# Patient Record
Sex: Female | Born: 2000 | Race: White | Hispanic: No | Marital: Married | Smoking: Never smoker
Health system: Southern US, Community
[De-identification: ages and names within clinical notes are randomized; demographics above are authoritative.]

## PROBLEM LIST (undated history)

## (undated) DIAGNOSIS — Z8489 Family history of other specified conditions: Secondary | ICD-10-CM

## (undated) DIAGNOSIS — Q51810 Arcuate uterus: Secondary | ICD-10-CM

## (undated) DIAGNOSIS — R748 Abnormal levels of other serum enzymes: Secondary | ICD-10-CM

## (undated) HISTORY — DX: Arcuate uterus: Q51.810

## (undated) HISTORY — PX: WISDOM TOOTH EXTRACTION: SHX21

---
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
## 2018-04-15 ENCOUNTER — Other Ambulatory Visit: Payer: Self-pay | Admitting: Family

## 2018-04-15 DIAGNOSIS — F411 Generalized anxiety disorder: Secondary | ICD-10-CM

## 2018-06-04 DIAGNOSIS — R6884 Jaw pain: Secondary | ICD-10-CM | POA: Diagnosis not present

## 2018-06-04 DIAGNOSIS — M26602 Left temporomandibular joint disorder, unspecified: Secondary | ICD-10-CM | POA: Diagnosis not present

## 2018-07-03 DIAGNOSIS — Z111 Encounter for screening for respiratory tuberculosis: Secondary | ICD-10-CM | POA: Diagnosis not present

## 2018-07-04 ENCOUNTER — Telehealth: Payer: Self-pay | Admitting: *Deleted

## 2018-07-04 NOTE — Telephone Encounter (Signed)
Left voicemail for patient to call, office note placed up front for patient pick up

## 2018-07-04 NOTE — Telephone Encounter (Signed)
Copied from [place] [id number]. Topic: General - Other >> Jul 04, 2018 11:38 AM Lennox Solders wrote: Reason for CRM: pt is calling and needs a copy of physical on 08-23-2016 to be release to her  mychart

## 2018-07-09 NOTE — Telephone Encounter (Signed)
mychart message sent

## 2018-07-25 DIAGNOSIS — Z30014 Encounter for initial prescription of intrauterine contraceptive device: Secondary | ICD-10-CM | POA: Diagnosis not present

## 2018-07-25 DIAGNOSIS — Z3202 Encounter for pregnancy test, result negative: Secondary | ICD-10-CM | POA: Diagnosis not present

## 2018-08-23 ENCOUNTER — Telehealth: Payer: 59 | Admitting: Family

## 2018-08-23 DIAGNOSIS — B9689 Other specified bacterial agents as the cause of diseases classified elsewhere: Secondary | ICD-10-CM | POA: Diagnosis not present

## 2018-08-23 DIAGNOSIS — R059 Cough, unspecified: Secondary | ICD-10-CM

## 2018-08-23 DIAGNOSIS — J329 Chronic sinusitis, unspecified: Secondary | ICD-10-CM

## 2018-08-23 DIAGNOSIS — R05 Cough: Secondary | ICD-10-CM | POA: Diagnosis not present

## 2018-08-23 MED ORDER — BENZONATATE 100 MG PO CAPS: 100.0000 mg | ORAL_CAPSULE | Freq: Three times a day (TID) | ORAL | 0 refills | Status: DC | PRN

## 2018-08-23 MED ORDER — DOXYCYCLINE HYCLATE 100 MG PO TABS: 100.0000 mg | ORAL_TABLET | Freq: Two times a day (BID) | ORAL | 0 refills | Status: DC

## 2018-08-23 NOTE — Progress Notes (Signed)
Thank you for the details you included in the comment boxes. Those details are very helpful in determining the best course of treatment for you and help [place] to provide the best care.  We are sorry that you are not feeling well.  Here is how we plan to help!  Based on what you have shared with me it looks like you have sinusitis.  Sinusitis is inflammation and infection in the sinus cavities of the head.  Based on your presentation I believe you most likely have Acute Bacterial Sinusitis.  This is an infection caused by bacteria and is treated with antibiotics. I have prescribed Doxycycline 100mg  by mouth twice a day for 10 days. You may use an oral decongestant such as Mucinex D or if you have glaucoma or high blood pressure use plain Mucinex. Saline nasal spray help and can safely be used as often as needed for congestion.  If you develop worsening sinus pain, fever or notice severe headache and vision changes, or if symptoms are not better after completion of antibiotic, please schedule an appointment with a health care provider.    I have also sent Tessalon Perles 100mg , take 1-2 every 8 hours as needed for cough.    Sinus infections are not as easily transmitted as other respiratory infection, however we still recommend that you avoid close contact with loved ones, especially the very young and elderly.  Remember to wash your hands thoroughly throughout the day as this is the number one way to prevent the spread of infection!  Home Care:  Only take medications as instructed by your medical team.  Complete the entire course of an antibiotic.  Do not take these medications with alcohol.  A steam or ultrasonic humidifier can help congestion.  You can place a towel over your head and breathe in the steam from hot water coming from a faucet.  Avoid close contacts especially the very young and the elderly.  Cover your mouth when you cough or sneeze.  Always remember to wash your hands.  Get  Help Right Away If:  You develop worsening fever or sinus pain.  You develop a severe head ache or visual changes.  Your symptoms persist after you have completed your treatment plan.  Make sure you  Understand these instructions.  Will watch your condition.  Will get help right away if you are not doing well or get worse.  Your e-visit answers were reviewed by a board certified advanced clinical practitioner to complete your personal care plan.  Depending on the condition, your plan could have included both over the counter or prescription medications.  If there is a problem please reply  once you have received a response from your provider.  Your safety is important to [place].  If you have drug allergies check your prescription carefully.    You can use MyChart to ask questions about today's visit, request a non-urgent call back, or ask for a work or school excuse for 24 hours related to this e-Visit. If it has been greater than 24 hours you will need to follow up with your provider, or enter a new e-Visit to address those concerns.  You will get an e-mail in the next two days asking about your experience.  I hope that your e-visit has been valuable and will speed your recovery. Thank you for using e-visits.

## 2019-04-01 ENCOUNTER — Telehealth: Payer: 59 | Admitting: Family

## 2019-04-01 DIAGNOSIS — N760 Acute vaginitis: Secondary | ICD-10-CM

## 2019-04-01 MED ORDER — METRONIDAZOLE 500 MG PO TABS: 500.0000 mg | ORAL_TABLET | Freq: Two times a day (BID) | ORAL | 0 refills | Status: DC

## 2019-04-01 NOTE — Progress Notes (Signed)

## 2020-10-19 LAB — OB RESULTS CONSOLE GC/CHLAMYDIA
Chlamydia: NEGATIVE
Gonorrhea: NEGATIVE

## 2020-10-22 LAB — OB RESULTS CONSOLE HIV ANTIBODY (ROUTINE TESTING): HIV: NONREACTIVE

## 2020-10-22 LAB — OB RESULTS CONSOLE HEPATITIS B SURFACE ANTIGEN: Hepatitis B Surface Ag: NEGATIVE

## 2020-10-22 LAB — OB RESULTS CONSOLE RPR: RPR: NONREACTIVE

## 2020-10-22 LAB — OB RESULTS CONSOLE RUBELLA ANTIBODY, IGM: Rubella: IMMUNE

## 2020-12-24 ENCOUNTER — Encounter: Payer: Self-pay | Admitting: Advanced Practice Midwife

## 2020-12-24 ENCOUNTER — Other Ambulatory Visit: Payer: Self-pay

## 2020-12-24 ENCOUNTER — Ambulatory Visit (INDEPENDENT_AMBULATORY_CARE_PROVIDER_SITE_OTHER): Payer: BC Managed Care – PPO | Admitting: Advanced Practice Midwife

## 2020-12-24 VITALS — BP 100/70 | Wt 177.0 lb

## 2020-12-24 DIAGNOSIS — Z369 Encounter for antenatal screening, unspecified: Secondary | ICD-10-CM

## 2020-12-24 DIAGNOSIS — Z3402 Encounter for supervision of normal first pregnancy, second trimester: Secondary | ICD-10-CM

## 2020-12-24 DIAGNOSIS — Z3A16 16 weeks gestation of pregnancy: Secondary | ICD-10-CM

## 2020-12-24 LAB — POCT URINALYSIS DIPSTICK OB
Bilirubin, UA: NEGATIVE
Blood, UA: NEGATIVE
Glucose, UA: NEGATIVE
Ketones, UA: NEGATIVE
Leukocytes, UA: NEGATIVE
Nitrite, UA: NEGATIVE
Spec Grav, UA: 1.015 (ref 1.010–1.025)
Urobilinogen, UA: 0.2 E.U./dL
pH, UA: 6 (ref 5.0–8.0)

## 2020-12-24 NOTE — Progress Notes (Signed)
NOB- no concerns ?

## 2020-12-24 NOTE — Patient Instructions (Signed)

## 2020-12-25 DIAGNOSIS — Z3403 Encounter for supervision of normal first pregnancy, third trimester: Secondary | ICD-10-CM | POA: Insufficient documentation

## 2020-12-25 NOTE — Progress Notes (Addendum)
New Obstetric Patient H&P  Date of Service: 12/24/2020  Transfer of care from: [facility]  Chief Complaint: "Desires prenatal care"  History of Present Illness: Patient is a 20 y.o. G1P0000 Not Hispanic or [place] female, presents with amenorrhea and positive home pregnancy test. Patient's last menstrual period was 08/28/2020 (exact date). and based on [redacted]w[redacted]d u/s 10/18/20, her EDD is Estimated Date of Delivery: 06/12/21 and her EGA is [redacted]w[redacted]d. EDD. Cycles are 5 days, regular, and occur approximately every : 28 days. Her last pap smear was 10/18/20 and was no abnormalities.    She had a urine pregnancy test which was positive 3 month(s)  ago. Her last menstrual period was normal and lasted for  5 day(s). Since her LMP she claims she has experienced breast tenderness, fatigue, nausea, vomiting. She denies vaginal bleeding. Her past medical history is noncontributory. This is her first pregnancy.  Since her LMP, she admits to the use of tobacco products  no She claims she has lost 3 pounds since the start of her pregnancy.  There are cats in the home in the home: yes she does not take care of litterbox She admits close contact with children on a regular basis  Yes, she is a Chief Technology Officer  She has had chicken pox in the past no She has had Tuberculosis exposures, symptoms, or previously tested positive for TB   no Current or past history of domestic violence. no  Genetic Screening/Teratology Counseling: (Includes patient, baby's father, or anyone in either family with:)   68. Patient's age >/= 24 at [facility]  no 2. Thalassemia ([place], [place], [place], or Asian background): MCV<80  no 3. Neural tube defect (meningomyelocele, spina bifida, anencephaly)  no 4. Congenital heart defect  no  5. Down syndrome  no 6. Tay-Sachs (Jewish, [place])  no 7. Canavan's Disease  no 8. Sickle cell disease or trait (African)  no  9. Hemophilia or other blood disorders  no  10.  Muscular dystrophy  no  11. Cystic fibrosis  no  12. Huntington's Chorea  no  13. Mental retardation/autism  no 14. Other inherited genetic or chromosomal disorder  no 15. Maternal metabolic disorder (DM, PKU, etc)  no 16. Patient or FOB with a child with a birth defect not listed above no  16a. Patient or FOB with a birth defect themselves no 17. Recurrent pregnancy loss, or stillbirth  no  18. Any medications since LMP other than prenatal vitamins (include vitamins, supplements, OTC meds, drugs, alcohol)  no 19. Any other genetic/environmental exposure to discuss  no  Infection History:   1. Lives with someone with TB or TB exposed  no  2. Patient or partner has history of genital herpes  no 3. Rash or viral illness since LMP  no 4. History of STI (GC, CT, HPV, syphilis, HIV)  no 5. History of recent travel :  no  Other pertinent information:  no    Review of Systems:10 point review of systems negative unless otherwise noted in HPI  Past Medical History:  Patient Active Problem List   Diagnosis Date Noted  . Encounter for supervision of normal first pregnancy in second trimester 12/25/2020    Clinic [facility] Prenatal Labs  Dating By 6w u/s [facility] Blood type: A negative  Genetic Screen 1 Screen: declined   AFP:     Quad:     NIPS: Antibody: negative  Anatomic [place]  Rubella: immune  Varicella: @VZVIGG @  GTT Early:  NA                Third trimester:  RPR: non-reactive   Rhogam  HBsAg: negative  Vaccines TDAP:                       Flu Shot: Covid: HIV: negative  Baby Food Breast                               GBS:   GC/CT:  Contraception  Pap: 10/18/2020 negative   CBB     CS/VBAC    Support Person Husband Rodman Key       . Family history of supraventricular tachycardia 04/25/2017  . GAD (generalized anxiety disorder) 04/25/2017  . Cholecystitis 06/05/2016    Past Surgical History:  Past Surgical History:  Procedure Laterality Date  . CHOLECYSTECTOMY N/A  06/09/2016   Procedure: LAPAROSCOPIC CHOLECYSTECTOMY WITH INTRAOPERATIVE CHOLANGIOGRAM;  Surgeon: Robert Bellow, MD;  Location: [facility] ORS;  Service: General;  Laterality: N/A;  . TEAR DUCT PROBING  2003  . WISDOM TOOTH EXTRACTION      Gynecologic History: Patient's last menstrual period was 08/28/2020 (exact date).  Obstetric History: G1P0000  Family History:  Family History  Problem Relation Age of Onset  . Breast cancer Maternal Aunt 40       times two great aunts  . Breast cancer Paternal Aunt 43       one   . Arthritis Maternal Grandmother   . Hyperlipidemia Maternal Grandfather   . Diabetes Mellitus II Maternal Grandfather   . Diabetes Paternal Grandfather     Social History:  Social History   Socioeconomic History  . Marital status: Married    Spouse name: Not on file  . Number of children: Not on file  . Years of education: Not on file  . Highest education level: Not on file  Occupational History  . Not on file  Tobacco Use  . Smoking status: Never Smoker  . Smokeless tobacco: Never Used  Vaping Use  . Vaping Use: Never used  Substance and Sexual Activity  . Alcohol use: No    Alcohol/week: 0.0 standard drinks  . Drug use: No  . Sexual activity: Yes    Birth control/protection: None  Other Topics Concern  . Not on file  Social History Narrative   11th grade - [employer]      One brother      Plays basketball      Social Determinants of Health   Financial Resource Strain: Not on file  Food Insecurity: Not on file  Transportation Needs: Not on file  Physical Activity: Not on file  Stress: Not on file  Social Connections: Not on file  Intimate Partner Violence: Not on file    Allergies:  Allergies  Allergen Reactions  . Penicillins Hives    Has patient had a PCN reaction causing immediate rash, facial/tongue/throat swelling, SOB or lightheadedness with hypotension: Yes Has patient had a PCN reaction causing severe rash involving  mucus membranes or skin necrosis: No Has patient had a PCN reaction that required hospitalization No Has patient had a PCN reaction occurring within the last 10 years: Yes If all of the above answers are "NO", then may proceed with Cephalosporin use. Has patient had a PCN reaction causing immediate rash, facial/tongue/throat swelling, SOB or lightheadedness with hypotension: Yes Has patient had a PCN reaction causing severe rash  involving mucus membranes or skin necrosis: No Has patient had a PCN reaction that required hospitalization No Has patient had a PCN reaction occurring within the last 10 years: Yes If all of the above answers are "NO", then may proceed with Cephalosporin use.     Medications: Prior to Admission medications   Medication Sig Start Date End Date Taking? Authorizing Provider  Doxylamine Succinate, Sleep, (UNISOM PO) Take by mouth.   Yes [provider]  Prenatal Vit-Fe Fumarate-FA (PRENATAL PO) Take by mouth.   Yes [provider]    Physical Exam Vitals: Blood pressure 100/70, weight 177 lb (80.3 kg), last menstrual period 08/28/2020.  General: NAD HEENT: normocephalic, anicteric Thyroid: no enlargement, no palpable nodules Pulmonary: No increased work of breathing, CTAB Cardiovascular: RRR, distal pulses 2+ Abdomen: NABS, soft, non-tender, non-distended.  Umbilicus without lesions.  No hepatomegaly, splenomegaly or masses palpable. No evidence of hernia  Genitourinary: deferred- recent PAP/dating of pregnancy Extremities: no edema, erythema, or tenderness Neurologic: Grossly intact Psychiatric: mood appropriate, affect full   The following were addressed during this visit:  Breastfeeding Education - Early initiation of breastfeeding    Comments: Keeps milk supply adequate, helps contract uterus and slow bleeding, and early milk is the perfect first food and is easy to digest.   - The importance of exclusive breastfeeding    Comments:  Provides antibodies, Lower risk of breast and ovarian cancers, and type-2 diabetes,Helps your body recover, Reduced chance of SIDS.   - Risks of giving your baby anything other than breast milk if you are breastfeeding    Comments: Make the baby less content with breastfeeds, may make my baby more susceptible to illness, and may reduce my milk supply.   - The importance of early skin-to-skin contact    Comments: Keeps baby warm and secure, helps keep baby's blood sugar up and breathing steady, easier to bond and breastfeed, and helps calm baby.  - Rooming-in on a 24-hour basis    Comments: Easier to learn baby's feeding cues, easier to bond and get to know each other, and encourages milk production.   - Feeding on demand or baby-led feeding    Comments: Helps prevent breastfeeding complications, helps bring in good milk supply, prevents under or overfeeding, and helps baby feel content and satisfied   - Frequent feeding to help assure optimal milk production    Comments: Making a full supply of milk requires frequent removal of milk from breasts, infant will eat 8-12 times in 24 hours, if separated from infant use breast massage, hand expression and/ or pumping to remove milk from breasts.   - Effective positioning and attachment    Comments: Helps my baby to get enough breast milk, helps to produce an adequate milk supply, and helps prevent nipple pain and damage   - Exclusive breastfeeding for the first 6 months    Comments: Builds a healthy milk supply and keeps it up, protects baby from sickness and disease, and breastmilk has everything your baby needs for the first 6 months.   Assessment: 20 y.o. G1P0000 at [redacted]w[redacted]d by u/s presenting to initiate prenatal care  Plan: 1) Avoid alcoholic beverages. 2) Patient encouraged not to smoke.  3) Discontinue the use of all non-medicinal drugs and chemicals.  4) Take prenatal vitamins daily.  5) Nutrition, food safety (fish, cheese  advisories, and high nitrite foods) and exercise discussed. 6) Hospital and practice style discussed with cross coverage system.  7) Genetic Screening, such as with 1st  Trimester Screening, cell free fetal DNA, AFP testing, and Ultrasound, as well as with amniocentesis and CVS as appropriate, is discussed with patient. At the conclusion of today's visit patient declined genetic testing 8) Patient is asked about travel to areas at risk for the [place] virus, and counseled to avoid travel and exposure to mosquitoes or sexual partners who may have themselves been exposed to the virus. Testing is discussed, and will be ordered as appropriate.  9) Return to clinic in 4 weeks for anatomy scan and [place], [employer] 12/25/2020, 4:52 PM

## 2020-12-29 NOTE — Telephone Encounter (Signed)
Called pt and advised per JEG to get some rest, try ice/heating pad, tylenol. Pt denies vag spotting/bleeding, cramping. Is aware if any of the previous mentioned to go to ER.

## 2021-01-20 ENCOUNTER — Ambulatory Visit (INDEPENDENT_AMBULATORY_CARE_PROVIDER_SITE_OTHER): Payer: BC Managed Care – PPO | Admitting: Obstetrics

## 2021-01-20 ENCOUNTER — Other Ambulatory Visit: Payer: Self-pay

## 2021-01-20 VITALS — BP 100/70 | Wt 178.0 lb

## 2021-01-20 DIAGNOSIS — Z3A16 16 weeks gestation of pregnancy: Secondary | ICD-10-CM

## 2021-01-20 DIAGNOSIS — Z3402 Encounter for supervision of normal first pregnancy, second trimester: Secondary | ICD-10-CM

## 2021-01-20 NOTE — Progress Notes (Signed)
ROB- leg cramps and headaches walking upstairs x 2-3 weeks

## 2021-01-20 NOTE — Progress Notes (Signed)
Routine Prenatal Care Visit  Subjective  Erika Hunt is a 20 y.o. G1P0000 at [redacted]w[redacted]d being seen today for ongoing prenatal care.  She is currently monitored for the following issues for this low-risk pregnancy and has Cholecystitis; Family history of supraventricular tachycardia; GAD (generalized anxiety disorder); and Encounter for supervision of normal first pregnancy in second trimester on their problem list.  ----------------------------------------------------------------------------------- Patient reports no bleeding, no contractions, no leaking and but does have occasional headaches, some acid reflux , and leg cramps.    .  .   Erika Hunt Fluid denies.  ----------------------------------------------------------------------------------- The following portions of the patient's history were reviewed and updated as appropriate: allergies, current medications, past family history, past medical history, past social history, past surgical history and problem list. Problem list updated.  Objective  Blood pressure 100/70, weight 178 lb (80.7 kg), last menstrual period 08/28/2020. Pregravid weight 180 lb (81.6 kg) Total Weight Gain -2 lb (-0.907 kg) Urinalysis: Urine Protein    Urine Glucose    Fetal Status:           General:  Alert, oriented and cooperative. Patient is in no acute distress.  Skin: Skin is warm and dry. No rash noted.   Cardiovascular: Normal heart rate noted  Respiratory: Normal respiratory effort, no problems with respiration noted  Abdomen: Soft, gravid, appropriate for gestational age.       Pelvic:  Cervical exam deferred        Extremities: Normal range of motion.     Mental Status: Normal mood and affect. Normal behavior. Normal judgment and thought content.   Assessment   20 y.o. G1P0000 at [redacted]w[redacted]d by  06/12/2021, by Ultrasound presenting for routine prenatal visit  Plan   FIRST Problems (from 12/24/20 to present)    Problem Noted Resolved   Encounter for  supervision of normal first pregnancy in second trimester 12/25/2020 by Rod Can, CNM No   Overview Addendum 12/28/2020  1:17 PM by Rod Can, [place] Prenatal Labs  Dating By 6w u/s [facility] Blood type: A negative  Genetic Screen 1 Screen: declined   AFP:     Quad:     NIPS: Antibody: negative  Anatomic [place]  Rubella: immune  Varicella: @VZVIGG @  GTT Early: NA                Third trimester:  RPR: non-reactive   Rhogam  HBsAg: negative  Vaccines TDAP:                       Flu Shot: Covid: HIV: negative  Baby Food Breast                               GBS:   GC/CT:  Contraception  Pap: 10/18/2020 negative   CBB     CS/VBAC    Support Person Husband Rodman Key          Previous Version       Preterm labor symptoms and general obstetric precautions including but not limited to vaginal bleeding, contractions, leaking of fluid and fetal movement were reviewed in detail with the patient. Please refer to After Visit Summary for other counseling recommendations. Advised magnesium for cramps in her legs; daily Pepcid for acid, and she has her anatomy scan set up already.    Return in about 4 weeks (around 02/17/2021) for return OB.  Imagene Riches, CNM  01/20/2021 2:12 PM

## 2021-01-27 ENCOUNTER — Other Ambulatory Visit: Payer: Self-pay

## 2021-01-27 ENCOUNTER — Ambulatory Visit
Admission: RE | Admit: 2021-01-27 | Discharge: 2021-01-27 | Disposition: A | Discharge status: Home / Self Care | Payer: BC Managed Care – PPO | Source: Ambulatory Visit | Attending: Advanced Practice Midwife | Admitting: Advanced Practice Midwife

## 2021-01-27 DIAGNOSIS — Z3402 Encounter for supervision of normal first pregnancy, second trimester: Secondary | ICD-10-CM

## 2021-01-27 DIAGNOSIS — Z369 Encounter for antenatal screening, unspecified: Secondary | ICD-10-CM

## 2021-02-03 ENCOUNTER — Ambulatory Visit: Payer: Self-pay

## 2021-02-04 ENCOUNTER — Ambulatory Visit: Payer: Self-pay

## 2021-02-04 ENCOUNTER — Other Ambulatory Visit: Payer: Self-pay

## 2021-02-09 ENCOUNTER — Ambulatory Visit: Payer: Self-pay

## 2021-02-10 ENCOUNTER — Ambulatory Visit (INDEPENDENT_AMBULATORY_CARE_PROVIDER_SITE_OTHER): Payer: BC Managed Care – PPO | Admitting: Advanced Practice Midwife

## 2021-02-10 ENCOUNTER — Other Ambulatory Visit: Payer: Self-pay

## 2021-02-10 VITALS — BP 100/60 | Wt 180.0 lb

## 2021-02-10 DIAGNOSIS — Z369 Encounter for antenatal screening, unspecified: Secondary | ICD-10-CM

## 2021-02-10 DIAGNOSIS — R399 Unspecified symptoms and signs involving the genitourinary system: Secondary | ICD-10-CM

## 2021-02-10 DIAGNOSIS — Z3689 Encounter for other specified antenatal screening: Secondary | ICD-10-CM

## 2021-02-10 DIAGNOSIS — Z3402 Encounter for supervision of normal first pregnancy, second trimester: Secondary | ICD-10-CM

## 2021-02-10 DIAGNOSIS — Z3A22 22 weeks gestation of pregnancy: Secondary | ICD-10-CM

## 2021-02-10 LAB — POCT URINALYSIS DIPSTICK OB
Bilirubin, UA: NEGATIVE
Blood, UA: NEGATIVE
Glucose, UA: NEGATIVE
Ketones, UA: NEGATIVE
Leukocytes, UA: NEGATIVE
Nitrite, UA: NEGATIVE
POC,PROTEIN,UA: NEGATIVE
Urobilinogen, UA: 0.2 E.U./dL
pH, UA: >=9 — AB (ref 5.0–8.0)

## 2021-02-10 NOTE — Progress Notes (Signed)
Routine Prenatal Care Visit  Subjective  Erika Hunt is a 20 y.o. G1P0000 at [redacted]w[redacted]d being seen today for ongoing prenatal care.  She is currently monitored for the following issues for this low-risk pregnancy and has Cholecystitis; Family history of supraventricular tachycardia; GAD (generalized anxiety disorder); and Encounter for supervision of normal first pregnancy in second trimester on their problem list.  ----------------------------------------------------------------------------------- Patient reports  some frequency and urgency of urination for the past two weeks. She denies burning. UA is unremarkable. We discussed anatomy scan 2 weeks ago and need for follow up RVOT .   Contractions: Not present. Vag. Bleeding: None.  Movement: Present. Leaking Fluid denies.  ----------------------------------------------------------------------------------- The following portions of the patient's history were reviewed and updated as appropriate: allergies, current medications, past family history, past medical history, past social history, past surgical history and problem list. Problem list updated.  Objective  Blood pressure 100/60, weight 180 lb (81.6 kg), last menstrual period 08/28/2020. Pregravid weight 180 lb (81.6 kg) Total Weight Gain 0 lb (0 kg) Urinalysis: Urine Protein Negative  Urine Glucose Negative  Results for Erika Hunt, Erika "Erika Hunt" (MRN [id number]) as of 02/10/2021 14:21  Ref. Range 02/10/2021 13:52  Bilirubin, UA Unknown neg  Glucose, UA Latest Ref Range: Negative  Negative  Ketones, UA Unknown neg  Leukocytes,UA Latest Ref Range: Negative  Negative  Nitrite, UA Unknown neg  pH, UA Latest Ref Range: 5.0 - 8.0  >=9.0 (A)  Protein Latest Ref Range: Negative, Trace, Small (1+), Moderate (2+), Large (3+), 4+  Negative  Urobilinogen, UA Latest Ref Range: 0.2 or 1.0 E.U./dL 0.2  RBC, UA Unknown neg    Fetal Status: Fetal Heart Rate (bpm): 140 Fundal Height: 22 cm Movement:  Present     Anatomy: 01/27/21 Incomplete for RVOT and otherwise normal, breech, placenta posterior fundal, growth 80%  General:  Alert, oriented and cooperative. Patient is in no acute distress.  Skin: Skin is warm and dry. No rash noted.   Cardiovascular: Normal heart rate noted  Respiratory: Normal respiratory effort, no problems with respiration noted  Abdomen: Soft, gravid, appropriate for gestational age. Pain/Pressure: Absent     Pelvic:  Cervical exam deferred        Extremities: Normal range of motion.  Edema: None  Mental Status: Normal mood and affect. Normal behavior. Normal judgment and thought content.    Assessment   20 y.o. G1P0000 at [redacted]w[redacted]d by  06/12/2021, by Ultrasound presenting for routine prenatal visit  Plan   FIRST Problems (from 12/24/20 to present)     Problem Noted Resolved   Encounter for supervision of normal first pregnancy in second trimester 12/25/2020 by Rod Can, CNM No   Overview Addendum 12/28/2020  1:17 PM by Rod Can, [place] Prenatal Labs  Dating By 6w u/s [facility] Blood type: A negative  Genetic Screen 1 Screen: declined   AFP:     Quad:     NIPS: Antibody: negative  Anatomic [place]  Rubella: immune  Varicella: @VZVIGG @  GTT Early: NA                Third trimester:  RPR: non-reactive   Rhogam  HBsAg: negative  Vaccines TDAP:                       Flu Shot: Covid: HIV: negative  Baby Food Breast  GBS:   GC/CT:  Contraception  Pap: 10/18/2020 negative   CBB     CS/VBAC    Support Person Husband Rodman Key                Preterm labor symptoms and general obstetric precautions including but not limited to vaginal bleeding, contractions, leaking of fluid and fetal movement were reviewed in detail with the patient.  Follow up as needed for UTI symptoms  Return in about 4 weeks (around 03/10/2021) for rob in 4 weeks and follow up anatomy in 2 weeks.  28 week labs and Rhogam at  28 weeks   Rod Can, [place] 02/10/2021 2:17 PM

## 2021-02-10 NOTE — Progress Notes (Signed)
ROB- frequency and some burning x 2 weeks

## 2021-02-14 ENCOUNTER — Telehealth: Payer: Self-pay

## 2021-02-14 NOTE — Telephone Encounter (Signed)
Pt called after hour nurse 02/12/21 11:26 am; 23 wks; may have gotten too hot outside; is dizzy and heart beat is very fast; has been inside for 13min resting but is not feeling any better.  Per JEG pt was adv to eat some protein and to hydrate; if doesn't help to go to ED.  [phone]  Pt states she ate some peanut butter and sat in front of a fan.  Finally felt better; has been fine today. Appreciative.

## 2021-03-09 ENCOUNTER — Ambulatory Visit (INDEPENDENT_AMBULATORY_CARE_PROVIDER_SITE_OTHER): Payer: 59 | Admitting: Obstetrics and Gynecology

## 2021-03-09 ENCOUNTER — Telehealth: Payer: Self-pay

## 2021-03-09 ENCOUNTER — Other Ambulatory Visit: Payer: Self-pay

## 2021-03-09 ENCOUNTER — Encounter: Payer: Self-pay | Admitting: Obstetrics and Gynecology

## 2021-03-09 VITALS — BP 122/74 | Ht 68.0 in | Wt 183.4 lb

## 2021-03-09 DIAGNOSIS — Z3A26 26 weeks gestation of pregnancy: Secondary | ICD-10-CM

## 2021-03-09 DIAGNOSIS — Z3402 Encounter for supervision of normal first pregnancy, second trimester: Secondary | ICD-10-CM

## 2021-03-09 LAB — POCT URINALYSIS DIPSTICK OB
Glucose, UA: NEGATIVE
POC,PROTEIN,UA: NEGATIVE

## 2021-03-09 NOTE — Telephone Encounter (Signed)
Pt calling; is 26wks; is dizzy, heart rate elevated; called a few weeks ago with the same thing.  [phone]  Called pt to see if she had eaten any protein and hydrated; states she is throwing up everything she tries to eat; states she has appt in [place] [facility] today at 3:30; adv to keep appt and in the meantime to sip clear liquids, hold ice in mouth, eat popcicles; if doesn't keep fluids down for 24hrs needs to go to ED for fluids.

## 2021-03-09 NOTE — Progress Notes (Signed)
Routine Prenatal Care Visit  Subjective  Erika Hunt is a 20 y.o. G1P0000 at [redacted]w[redacted]d being seen today for ongoing prenatal care.  She is currently monitored for the following issues for this low-risk pregnancy and has Cholecystitis; Family history of supraventricular tachycardia; GAD (generalized anxiety disorder); and Encounter for supervision of normal first pregnancy in second trimester on their problem list.  ----------------------------------------------------------------------------------- Patient reports feeling dizzy, blurry vision, rapid heart rate while shopping today. This resolved after resting and having cool air blown cross her.   Contractions: Not present. Vag. Bleeding: None.  Movement: Present. Leaking Fluid denies.  ----------------------------------------------------------------------------------- The following portions of the patient's history were reviewed and updated as appropriate: allergies, current medications, past family history, past medical history, past social history, past surgical history and problem list. Problem list updated.  Objective  Blood pressure 122/74, height 5\' 8"  (1.727 m), weight 183 lb 6.4 oz (83.2 kg), last menstrual period 08/28/2020. Pregravid weight 180 lb (81.6 kg) Total Weight Gain 3 lb 6.4 oz (1.542 kg) Urinalysis: Urine Protein Negative  Urine Glucose Negative  Fetal Status: Fetal Heart Rate (bpm): 145 Fundal Height: 26 cm Movement: Present     General:  Alert, oriented and cooperative. Patient is in no acute distress.  Skin: Skin is warm and dry. No rash noted.   Cardiovascular: Normal heart rate noted  Respiratory: Normal respiratory effort, no problems with respiration noted  Abdomen: Soft, gravid, appropriate for gestational age. Pain/Pressure: Absent     Pelvic:  Cervical exam deferred        Extremities: Normal range of motion.     Mental Status: Normal mood and affect. Normal behavior. Normal judgment and thought content.    Assessment   20 y.o. G1P0000 at [redacted]w[redacted]d by  06/12/2021, by Ultrasound presenting for work-in prenatal visit  Plan   FIRST Problems (from 12/24/20 to present)     Problem Noted Resolved   Encounter for supervision of normal first pregnancy in second trimester 12/25/2020 by Rod Can, CNM No   Overview Addendum 12/28/2020  1:17 PM by Rod Can, [place] Prenatal Labs  Dating By 6w u/s [facility] Blood type: A negative  Genetic Screen 1 Screen: declined   AFP:     Quad:     NIPS: Antibody: negative  Anatomic [place]  Rubella: immune  Varicella: @VZVIGG @  GTT Early: NA                Third trimester:  RPR: non-reactive   Rhogam  HBsAg: negative  Vaccines TDAP:                       Flu Shot: Covid: HIV: negative  Baby Food Breast                               GBS:   GC/CT:  Contraception  Pap: 10/18/2020 negative   CBB     CS/VBAC    Support Person Husband Rodman Key               Preterm labor symptoms and general obstetric precautions including but not limited to vaginal bleeding, contractions, leaking of fluid and fetal movement were reviewed in detail with the patient. Please refer to After Visit Summary for other counseling recommendations.   Symptoms very consistent with compression the inf vena cava.  Discussed anatomy and discussed ways to make sx improve faster. Precautions given  for circumstances where symptoms do not resolve and need to go to the ER.   Return for keep previously scheduled appts.   Prentice Docker, MD, [facility], [facility] 03/09/2021 4:27 PM

## 2021-03-16 ENCOUNTER — Other Ambulatory Visit: Payer: Self-pay

## 2021-03-16 ENCOUNTER — Ambulatory Visit
Admission: RE | Admit: 2021-03-16 | Discharge: 2021-03-16 | Disposition: A | Discharge status: Home / Self Care | Payer: 59 | Source: Ambulatory Visit | Attending: Advanced Practice Midwife | Admitting: Advanced Practice Midwife

## 2021-03-16 DIAGNOSIS — Z3689 Encounter for other specified antenatal screening: Secondary | ICD-10-CM | POA: Insufficient documentation

## 2021-03-16 DIAGNOSIS — Z369 Encounter for antenatal screening, unspecified: Secondary | ICD-10-CM | POA: Insufficient documentation

## 2021-03-16 DIAGNOSIS — Z3402 Encounter for supervision of normal first pregnancy, second trimester: Secondary | ICD-10-CM | POA: Insufficient documentation

## 2021-03-21 ENCOUNTER — Other Ambulatory Visit: Payer: Self-pay

## 2021-03-21 ENCOUNTER — Encounter: Payer: Self-pay | Admitting: Obstetrics and Gynecology

## 2021-03-21 ENCOUNTER — Other Ambulatory Visit: Payer: 59

## 2021-03-21 ENCOUNTER — Ambulatory Visit (INDEPENDENT_AMBULATORY_CARE_PROVIDER_SITE_OTHER): Payer: 59 | Admitting: Obstetrics and Gynecology

## 2021-03-21 VITALS — BP 112/70 | Ht 68.0 in | Wt 189.0 lb

## 2021-03-21 DIAGNOSIS — Z3A28 28 weeks gestation of pregnancy: Secondary | ICD-10-CM

## 2021-03-21 DIAGNOSIS — Z3403 Encounter for supervision of normal first pregnancy, third trimester: Secondary | ICD-10-CM

## 2021-03-21 DIAGNOSIS — O26899 Other specified pregnancy related conditions, unspecified trimester: Secondary | ICD-10-CM

## 2021-03-21 DIAGNOSIS — Z6791 Unspecified blood type, Rh negative: Secondary | ICD-10-CM

## 2021-03-21 DIAGNOSIS — Z113 Encounter for screening for infections with a predominantly sexual mode of transmission: Secondary | ICD-10-CM

## 2021-03-21 DIAGNOSIS — Z131 Encounter for screening for diabetes mellitus: Secondary | ICD-10-CM

## 2021-03-21 MED ORDER — RHO D IMMUNE GLOBULIN 1500 UNIT/2ML IJ SOSY: 300.0000 ug | PREFILLED_SYRINGE | Freq: Once | INTRAMUSCULAR | Status: DC

## 2021-03-21 NOTE — Progress Notes (Signed)
Routine Prenatal Care Visit  Subjective  Erika Hunt is a 20 y.o. G1P0000 at 46w1dbeing seen today for ongoing prenatal care.  She is currently monitored for the following issues for this low-risk pregnancy and has Cholecystitis; Family history of supraventricular tachycardia; GAD (generalized anxiety disorder); Encounter for supervision of normal first pregnancy in second trimester; and Rh negative state in antepartum period on their problem list.  ----------------------------------------------------------------------------------- Patient reports no complaints.   Contractions: Not present. Vag. Bleeding: None.  Movement: Present. Leaking Fluid denies.  Anatomy u/s for RVOT complete. ----------------------------------------------------------------------------------- The following portions of the patient's history were reviewed and updated as appropriate: allergies, current medications, past family history, past medical history, past social history, past surgical history and problem list. Problem list updated.  Objective  Blood pressure 112/70, height '5\' 8"'$  (1.727 m), weight 189 lb (85.7 kg), last menstrual period 08/28/2020. Pregravid weight 180 lb (81.6 kg) Total Weight Gain 9 lb (4.082 kg) Urinalysis: Urine Protein    Urine Glucose    Fetal Status: Fetal Heart Rate (bpm): 145 Fundal Height: 28 cm Movement: Present     General:  Alert, oriented and cooperative. Patient is in no acute distress.  Skin: Skin is warm and dry. No rash noted.   Cardiovascular: Normal heart rate noted  Respiratory: Normal respiratory effort, no problems with respiration noted  Abdomen: Soft, gravid, appropriate for gestational age. Pain/Pressure: Absent     Pelvic:  Cervical exam deferred        Extremities: Normal range of motion.  Edema: None  Mental Status: Normal mood and affect. Normal behavior. Normal judgment and thought content.   Assessment   20y.o. G1P0000 at 252w1dy  06/12/2021, by  Ultrasound presenting for routine prenatal visit  Plan   FIRST Problems (from 12/24/20 to present)     Problem Noted Resolved   Rh negative state in antepartum period 03/21/2021 by JaWill BonnetMD No   Encounter for supervision of normal first pregnancy in second trimester 12/25/2020 by GlRod CanCNM No   Overview Addendum 03/21/2021  4:11 PM by JaWill BonnetMD    Clinic [facility] Prenatal Labs  Dating By 6w u/s [facility] Blood type: A negative  Genetic Screen 1 Screen: declined   AFP:     Quad:     NIPS: Antibody: negative  Anatomic [place] by 27 wks Rubella: immune  Varicella: ??  GTT Early: NA                Third trimester:  RPR: non-reactive   Rhogam  HBsAg: negative  Vaccines TDAP:                       Flu Shot: Covid: HIV: negative  Baby Food Breast                               GBS:   GC/CT:  Contraception  Pap: 10/18/2020 negative   CBB     CS/VBAC    Support Person Husband MaRodman Key             Preterm labor symptoms and general obstetric precautions including but not limited to vaginal bleeding, contractions, leaking of fluid and fetal movement were reviewed in detail with the patient. Please refer to After Visit Summary for other counseling recommendations.   - Did not have labs scheduled for today. WIll get ASAP with rhogam.  Return in about 2 weeks (around 04/04/2021) for labs/rhogam ASAP, and Routine Prenatal Appointment 2 wks (may make multiple appts).   Prentice Docker, MD, [facility], [facility] 03/21/2021 4:11 PM

## 2021-03-22 ENCOUNTER — Other Ambulatory Visit: Payer: 59

## 2021-03-22 ENCOUNTER — Ambulatory Visit (INDEPENDENT_AMBULATORY_CARE_PROVIDER_SITE_OTHER): Payer: 59

## 2021-03-22 DIAGNOSIS — Z6791 Unspecified blood type, Rh negative: Secondary | ICD-10-CM

## 2021-03-22 DIAGNOSIS — O26899 Other specified pregnancy related conditions, unspecified trimester: Secondary | ICD-10-CM

## 2021-03-22 DIAGNOSIS — Z2913 Encounter for prophylactic Rho(D) immune globulin: Secondary | ICD-10-CM

## 2021-03-22 DIAGNOSIS — Z113 Encounter for screening for infections with a predominantly sexual mode of transmission: Secondary | ICD-10-CM

## 2021-03-22 DIAGNOSIS — Z131 Encounter for screening for diabetes mellitus: Secondary | ICD-10-CM

## 2021-03-22 DIAGNOSIS — Z3403 Encounter for supervision of normal first pregnancy, third trimester: Secondary | ICD-10-CM

## 2021-03-22 MED ORDER — RHO D IMMUNE GLOBULIN 1500 UNIT/2ML IJ SOSY
300.0000 ug | PREFILLED_SYRINGE | Freq: Once | INTRAMUSCULAR | Status: AC
  Administered 2021-03-22: 300 ug via INTRAMUSCULAR

## 2021-03-22 NOTE — Progress Notes (Signed)
Patient presents today for Rhogam injection after 28 week labs. Given IM RUOQ. Patient tolerated well.

## 2021-03-23 LAB — 28 WEEKS RH-PANEL
Antibody Screen: NEGATIVE
Basophils Absolute: 0 10*3/uL (ref 0.0–0.2)
Basos: 0 %
EOS (ABSOLUTE): 0 10*3/uL (ref 0.0–0.4)
Eos: 1 %
Gestational Diabetes Screen: 111 mg/dL (ref 65–139)
HIV Screen 4th Generation wRfx: NONREACTIVE
Hematocrit: 33.2 % — ABNORMAL LOW (ref 34.0–46.6)
Hemoglobin: 11 g/dL — ABNORMAL LOW (ref 11.1–15.9)
Immature Grans (Abs): 0 10*3/uL (ref 0.0–0.1)
Immature Granulocytes: 1 %
Lymphocytes Absolute: 1.4 10*3/uL (ref 0.7–3.1)
Lymphs: 19 %
MCH: 28.6 pg (ref 26.6–33.0)
MCHC: 33.1 g/dL (ref 31.5–35.7)
MCV: 87 fL (ref 79–97)
Monocytes Absolute: 0.3 10*3/uL (ref 0.1–0.9)
Monocytes: 5 %
Neutrophils Absolute: 5.5 10*3/uL (ref 1.4–7.0)
Neutrophils: 74 %
Platelets: 263 10*3/uL (ref 150–450)
RBC: 3.84 x10E6/uL (ref 3.77–5.28)
RDW: 13.3 % (ref 11.7–15.4)
RPR Ser Ql: NONREACTIVE
WBC: 7.3 10*3/uL (ref 3.4–10.8)

## 2021-04-06 ENCOUNTER — Ambulatory Visit (INDEPENDENT_AMBULATORY_CARE_PROVIDER_SITE_OTHER): Payer: 59 | Admitting: Obstetrics and Gynecology

## 2021-04-06 ENCOUNTER — Other Ambulatory Visit: Payer: Self-pay

## 2021-04-06 ENCOUNTER — Encounter: Payer: Self-pay | Admitting: Obstetrics and Gynecology

## 2021-04-06 VITALS — BP 114/70 | Ht 68.0 in | Wt 193.0 lb

## 2021-04-06 DIAGNOSIS — K219 Gastro-esophageal reflux disease without esophagitis: Secondary | ICD-10-CM

## 2021-04-06 DIAGNOSIS — Z3A30 30 weeks gestation of pregnancy: Secondary | ICD-10-CM

## 2021-04-06 DIAGNOSIS — Z3403 Encounter for supervision of normal first pregnancy, third trimester: Secondary | ICD-10-CM

## 2021-04-06 DIAGNOSIS — Z23 Encounter for immunization: Secondary | ICD-10-CM | POA: Diagnosis not present

## 2021-04-06 DIAGNOSIS — Z3402 Encounter for supervision of normal first pregnancy, second trimester: Secondary | ICD-10-CM

## 2021-04-06 MED ORDER — SUCRALFATE 1 G PO TABS: 1.0000 g | ORAL_TABLET | Freq: Four times a day (QID) | ORAL | 1 refills | Status: DC

## 2021-04-06 NOTE — Progress Notes (Signed)
Routine Prenatal Care Visit  Subjective  Erika Hunt is a 20 y.o. G1P0000 at 17w3dbeing seen today for ongoing prenatal care.  She is currently monitored for the following issues for this low-risk pregnancy and has Cholecystitis; Family history of supraventricular tachycardia; GAD (generalized anxiety disorder); Encounter for supervision of normal first pregnancy in second trimester; and Rh negative state in antepartum period on their problem list.  ----------------------------------------------------------------------------------- Patient reports  continued GERD symptoms .   Contractions: Not present. Vag. Bleeding: None.  Movement: Present. Denies leaking of fluid.  ----------------------------------------------------------------------------------- The following portions of the patient's history were reviewed and updated as appropriate: allergies, current medications, past family history, past medical history, past social history, past surgical history and problem list. Problem list updated.   Objective  Blood pressure 114/70, height '5\' 8"'$  (1.727 m), weight 193 lb (87.5 kg), last menstrual period 08/28/2020. Pregravid weight 180 lb (81.6 kg) Total Weight Gain 13 lb (5.897 kg) Urinalysis:      Fetal Status:     Movement: Present     General:  Alert, oriented and cooperative. Patient is in no acute distress.  Skin: Skin is warm and dry. No rash noted.   Cardiovascular: Normal heart rate noted  Respiratory: Normal respiratory effort, no problems with respiration noted  Abdomen: Soft, gravid, appropriate for gestational age. Pain/Pressure: Absent     Pelvic:  Cervical exam deferred        Extremities: Normal range of motion.     Mental Status: Normal mood and affect. Normal behavior. Normal judgment and thought content.     Assessment   20y.o. G1P0000 at 376w3dy  06/12/2021, by Ultrasound presenting for routine prenatal visit  Plan   FIRST Problems (from 12/24/20 to  present)     Problem Noted Resolved   Rh negative state in antepartum period 03/21/2021 by JaWill BonnetMD No   Encounter for supervision of normal first pregnancy in second trimester 12/25/2020 by GlRod CanCNM No   Overview Addendum 04/06/2021  4:10 PM by ScHomero FellersMD     Nursing Staff Provider  Office Location  [facility] Dating  6 wk [place]  English Anatomy [place]plete  Flu Vaccine   Genetic Screen  NIPS: none  TDaP vaccine   04/06/2021 Hgb A1C or  GTT Third trimester : 111  Covid Unvaccinated Covid, Jan 2022   LAB RESULTS   Rhogam  03/21/2021 Blood Type   A negative  Feeding Plan Breast Antibody Negative (08/02 1118)  Contraception condoms Rubella    Circumcision  RPR Non Reactive (08/02 1118)   Pediatrician  [facility] HBsAg   negative  Support Person Husband: matthew HIV Non Reactive (08/02 1118)  Prenatal Classes discussed Varicella     GBS  (For PCN allergy, check sensitivities)   BTL Consent     VBAC Consent  Pap  10/18/2020- normal per patient, need records    Hgb Electro      CF      SMA                   Gestational age appropriate obstetric precautions including but not limited to vaginal bleeding, contractions, leaking of fluid and fetal movement were reviewed in detail with the patient.    Return in about 2 weeks (around 04/20/2021) for ROB every 2 weeks for 3 visist then weekly ROB for 4 weeks .  ChHomero FellersD [facility], [facility] 04/06/2021, 4:13 PM

## 2021-04-06 NOTE — Patient Instructions (Addendum)
Gastroesophageal Reflux Disease, Adult Gastroesophageal reflux (GER) happens when acid from the stomach flows up into the tube that connects the mouth and the stomach (esophagus). Normally, food travels down the esophagus and stays in the stomach to be digested. However, when a person has GER, food and stomach acid sometimes move back up into the esophagus. If this becomes a more serious problem, the person may be diagnosed with a disease called gastroesophageal reflux disease (GERD). GERD occurs when the reflux: Happens often. Causes frequent or severe symptoms. Causes problems such as damage to the esophagus. When stomach acid comes in contact with the esophagus, the acid may cause inflammation in the esophagus. Over time, GERD may create small holes (ulcers) in the lining of the esophagus. What are the causes? This condition is caused by a problem with the muscle between the esophagus and the stomach (lower esophageal sphincter, or LES). Normally, the LES muscle closes after food passes through the esophagus to the stomach. When the LES is weakened or abnormal, it does not close properly, and that allows food and stomach acid to go back up into theesophagus. The LES can be weakened by certain dietary substances, medicines, and medical conditions, including: Tobacco use. Pregnancy. Having a hiatal hernia. Alcohol use. Certain foods and beverages, such as coffee, chocolate, onions, and peppermint. What increases the risk? You are more likely to develop this condition if you: Have an increased body weight. Have a connective tissue disorder. Take NSAIDs, such as ibuprofen. What are the signs or symptoms? Symptoms of this condition include: Heartburn. Difficult or painful swallowing and the feeling of having a lump in the throat. A bitter taste in the mouth. Bad breath and having a large amount of saliva. Having an upset or bloated stomach and belching. Chest pain. Different conditions can  cause chest pain. Make sure you see your health care provider if you experience chest pain. Shortness of breath or wheezing. Ongoing (chronic) cough or a nighttime cough. Wearing away of tooth enamel. Weight loss. How is this diagnosed? This condition may be diagnosed based on a medical history and a physical exam. To determine if you have mild or severe GERD, your health care provider may also monitor how you respond to treatment. You may also have tests, including: A test to examine your stomach and esophagus with a small camera (endoscopy). A test that measures the acidity level in your esophagus. A test that measures how much pressure is on your esophagus. A barium swallow or modified barium swallow test to show the shape, size, and functioning of your esophagus. How is this treated? Treatment for this condition may vary depending on how severe your symptoms are. Your health care provider may recommend: Changes to your diet. Medicine. Surgery. The goal of treatment is to help relieve your symptoms and to preventcomplications. Follow these instructions at home: Eating and drinking  Follow a diet as recommended by your health care provider. This may involve avoiding foods and drinks such as: Coffee and tea, with or without caffeine. Drinks that contain alcohol. Energy drinks and sports drinks. Carbonated drinks or sodas. Chocolate and cocoa. Peppermint and mint flavorings. Garlic and onions. Horseradish. Spicy and acidic foods, including peppers, chili powder, curry powder, vinegar, hot sauces, and barbecue sauce. Citrus fruit juices and citrus fruits, such as oranges, lemons, and limes. Tomato-based foods, such as red sauce, chili, salsa, and pizza with red sauce. Fried and fatty foods, such as donuts, french fries, potato chips, and high-fat dressings. High-fat meats,  such as hot dogs and fatty cuts of red and white meats, such as rib eye steak, sausage, ham, and bacon. High-fat  dairy items, such as whole milk, butter, and cream cheese. Eat small, frequent meals instead of large meals. Avoid drinking large amounts of liquid with your meals. Avoid eating meals during the 2-3 hours before bedtime. Avoid lying down right after you eat. Do not exercise right after you eat.  Lifestyle  Do not use any products that contain nicotine or tobacco. These products include cigarettes, chewing tobacco, and vaping devices, such as e-cigarettes. If you need help quitting, ask your health care provider. Try to reduce your stress by using methods such as yoga or meditation. If you need help reducing stress, ask your health care provider. If you are overweight, reduce your weight to an amount that is healthy for you. Ask your health care provider for guidance about a safe weight loss goal.  General instructions Pay attention to any changes in your symptoms. Take over-the-counter and prescription medicines only as told by your health care provider. Do not take aspirin, ibuprofen, or other NSAIDs unless your health care provider told you to take these medicines. Wear loose-fitting clothing. Do not wear anything tight around your waist that causes pressure on your abdomen. Raise (elevate) the head of your bed about 6 inches (15 cm). You can use a wedge to do this. Avoid bending over if this makes your symptoms worse. Keep all follow-up visits. This is important. Contact a health care provider if: You have: New symptoms. Unexplained weight loss. Difficulty swallowing or it hurts to swallow. Wheezing or a persistent cough. A hoarse voice. Your symptoms do not improve with treatment. Get help right away if: You have sudden pain in your arms, neck, jaw, teeth, or back. You suddenly feel sweaty, dizzy, or light-headed. You have chest pain or shortness of breath. You vomit and the vomit is green, yellow, or black, or it looks like blood or coffee grounds. You faint. You have stool  that is red, bloody, or black. You cannot swallow, drink, or eat. These symptoms may represent a serious problem that is an emergency. Do not wait to see if the symptoms will go away. Get medical help right away. Call your local emergency services (911 in the [place] Do not drive yourself to the hospital. Summary Gastroesophageal reflux happens when acid from the stomach flows up into the esophagus. GERD is a disease in which the reflux happens often, causes frequent or severe symptoms, or causes problems such as damage to the esophagus. Treatment for this condition may vary depending on how severe your symptoms are. Your health care provider may recommend diet and lifestyle changes, medicine, or surgery. Contact a health care provider if you have new or worsening symptoms. Take over-the-counter and prescription medicines only as told by your health care provider. Do not take aspirin, ibuprofen, or other NSAIDs unless your health care provider told you to do so. Keep all follow-up visits as told by your health care provider. This is important. This information is not intended to replace advice given to you by your health care provider. Make sure you discuss any questions you have with your healthcare provider. Document Revised: 02/16/2020 Document Reviewed: 02/16/2020 Elsevier Patient Education  2022 [place] for Gastroesophageal Reflux Disease, Adult When you have gastroesophageal reflux disease (GERD), the foods you eat and your eating habits are very important. Choosing the right foods can help ease your discomfort. Think about  working with a Publishing rights manager (dietitian) to help you make good choices. What are tips for following this plan? Reading food labels Look for foods that are low in saturated fat. Foods that may help with your symptoms include: Foods that have less than 5% of daily value (DV) of fat. Foods that have 0 grams of trans fat. Cooking Do not fry your food. Cook  your food by baking, steaming, grilling, or broiling. These are all methods that do not need a lot of fat for cooking. To add flavor, try to use herbs that are low in spice and acidity. Meal planning  Choose healthy foods that are low in fat, such as: Fruits and vegetables. Whole grains. Low-fat dairy products. Lean meats, fish, and poultry. Eat small meals often instead of eating 3 large meals each day. Eat your meals slowly in a place where you are relaxed. Avoid bending over or lying down until 2-3 hours after eating. Limit high-fat foods such as fatty meats or fried foods. Limit your intake of fatty foods, such as oils, butter, and shortening. Avoid the following as told by your doctor: Foods that cause symptoms. These may be different for different people. Keep a food diary to keep track of foods that cause symptoms. Alcohol. Drinking a lot of liquid with meals. Eating meals during the 2-3 hours before bed.  Lifestyle Stay at a healthy weight. Ask your doctor what weight is healthy for you. If you need to lose weight, work with your doctor to do so safely. Exercise for at least 30 minutes on 5 or more days each week, or as told by your doctor. Wear loose-fitting clothes. Do not smoke or use any products that contain nicotine or tobacco. If you need help quitting, ask your doctor. Sleep with the head of your bed higher than your feet. Use a wedge under the mattress or blocks under the bed frame to raise the head of the bed. Chew sugar-free gum after meals. What foods should eat?  Eat a healthy, well-balanced diet of fruits, vegetables, whole grains, low-fatdairy products, lean meats, fish, and poultry. Each person is different. Foods that may cause symptoms in one person may not cause any symptoms inanother person. Work with your doctor to find foods that are safe for you. The items listed above may not be a complete list of what you can eat and drink. Contact a food expert for more  options. What foods should I avoid? Limiting some of these foods may help in managing the symptoms of GERD. Everyone is different. Talk with a food expert or your doctor to help you findthe exact foods to avoid, if any. Fruits Any fruits prepared with added fat. Any fruits that cause symptoms. For some people, this may include citrus fruits, such as oranges, grapefruit, pineapple,and lemons. Vegetables Deep-fried vegetables. [place] fries. Any vegetables prepared with added fat. Any vegetables that cause symptoms. For some people, this may include tomatoesand tomato products, chili peppers, onions and garlic, and horseradish. Grains Pastries or quick breads with added fat. Meats and other proteins High-fat meats, such as fatty beef or pork, hot dogs, ribs, ham, sausage, salami, and bacon. Fried meat or protein, including fried fish and friedchicken. Nuts and nut butters, in large amounts. Dairy Whole milk and chocolate milk. Sour cream. Cream. Ice cream. Cream cheese.Milkshakes. Fats and oils Butter. Margarine. Shortening. Ghee. Beverages Coffee and tea, with or without caffeine. Carbonated beverages. Sodas. Energy drinks. Fruit juice made with acidic fruits, such as orange  or grapefruit.Tomato juice. Alcoholic drinks. Sweets and desserts Chocolate and cocoa. Donuts. Seasonings and condiments Pepper. Peppermint and spearmint. Added salt. Any condiments, herbs, or seasonings that cause symptoms. For some people, this may include curry, hotsauce, or vinegar-based salad dressings. The items listed above may not be a complete list of what you should not eat and drink. Contact a food expert for more options. Questions to ask your doctor Diet and lifestyle changes are often the first steps that are taken to manage symptoms of GERD. If diet and lifestyle changes do not help, talk with yourdoctor about taking medicines. Where to find more information International Foundation for Gastrointestinal  Disorders: aboutgerd.org Summary When you have GERD, food and lifestyle choices are very important in easing your symptoms. Eat small meals often instead of 3 large meals a day. Eat your meals slowly and in a place where you are relaxed. Avoid bending over or lying down until 2-3 hours after eating. Limit high-fat foods such as fatty meats or fried foods. This information is not intended to replace advice given to you by your health care provider. Make sure you discuss any questions you have with your healthcare provider. Document Revised: 02/16/2020 Document Reviewed: 02/16/2020 Elsevier Patient Education  2022 [place] of Pregnancy  The third trimester of pregnancy is from week 28 through week 68. This is months 7 through 9. The third trimester is a time when the unborn baby (fetus) is growing rapidly. At the end of the ninth month, the fetus is about 20inches long and weighs 6-10 pounds. Body changes during your third trimester During the third trimester, your body will continue to go through many changes.The changes vary and generally return to normal after your baby is born. Physical changes Your weight will continue to increase. You can expect to gain 25-35 pounds (11-16 kg) by the end of the pregnancy if you begin pregnancy at a normal weight. If you are underweight, you can expect to gain 28-40 lb (about 13-18 kg), and if you are overweight, you can expect to gain 15-25 lb (about 7-11 kg). You may begin to get stretch marks on your hips, abdomen, and breasts. Your breasts will continue to grow and may hurt. A yellow fluid (colostrum) may leak from your breasts. This is the first milk you are producing for your baby. You may have changes in your hair. These can include thickening of your hair, rapid growth, and changes in texture. Some people also have hair loss during or after pregnancy, or hair that feels dry or thin. Your belly button may stick out. You may  notice more swelling in your hands, face, or ankles. Health changes You may have heartburn. You may have constipation. You may develop hemorrhoids. You may develop swollen, bulging veins (varicose veins) in your legs. You may have increased body aches in the pelvis, back, or thighs. This is due to weight gain and increased hormones that are relaxing your joints. You may have increased tingling or numbness in your hands, arms, and legs. The skin on your abdomen may also feel numb. You may feel short of breath because of your expanding uterus. Other changes You may urinate more often because the fetus is moving lower into your pelvis and pressing on your bladder. You may have more problems sleeping. This may be caused by the size of your abdomen, an increased need to urinate, and an increase in your body's metabolism. You may notice the fetus "dropping," or moving lower  in your abdomen (lightening). You may have increased vaginal discharge. You may notice that you have pain around your pelvic bone as your uterus distends. Follow these instructions at home: Medicines Follow your health care provider's instructions regarding medicine use. Specific medicines may be either safe or unsafe to take during pregnancy. Do not take any medicines unless approved by your health care provider. Take a prenatal vitamin that contains at least 600 micrograms (mcg) of folic acid. Eating and drinking Eat a healthy diet that includes fresh fruits and vegetables, whole grains, good sources of protein such as meat, eggs, or tofu, and low-fat dairy products. Avoid raw meat and unpasteurized juice, milk, and cheese. These carry germs that can harm you and your baby. Eat 4 or 5 small meals rather than 3 large meals a day. You may need to take these actions to prevent or treat constipation: Drink enough fluid to keep your urine pale yellow. Eat foods that are high in fiber, such as beans, whole grains, and fresh fruits  and vegetables. Limit foods that are high in fat and processed sugars, such as fried or sweet foods. Activity Exercise only as directed by your health care provider. Most people can continue their usual exercise routine during pregnancy. Try to exercise for 30 minutes at least 5 days a week. Stop exercising if you experience contractions in the uterus. Stop exercising if you develop pain or cramping in the lower abdomen or lower back. Avoid heavy lifting. Do not exercise if it is very hot or humid or if you are at a high altitude. If you choose to, you may continue to have sex unless your health care provider tells you not to. Relieving pain and discomfort Take frequent breaks and rest with your legs raised (elevated) if you have leg cramps or low back pain. Take warm sitz baths to soothe any pain or discomfort caused by hemorrhoids. Use hemorrhoid cream if your health care provider approves. Wear a supportive bra to prevent discomfort from breast tenderness. If you develop varicose veins: Wear support hose as told by your health care provider. Elevate your feet for 15 minutes, 3-4 times a day. Limit salt in your diet. Safety Talk to your health care provider before traveling far distances. Do not use hot tubs, steam rooms, or saunas. Wear your seat belt at all times when driving or riding in a car. Talk with your health care provider if someone is verbally or physically abusive to you. Preparing for birth To prepare for the arrival of your baby: Take prenatal classes to understand, practice, and ask questions about labor and delivery. Visit the hospital and tour the maternity area. Purchase a rear-facing car seat and make sure you know how to install it in your car. Prepare the baby's room or sleeping area. Make sure to remove all pillows and stuffed animals from the baby's crib to prevent suffocation. General instructions Avoid cat litter boxes and soil used by cats. These carry germs  that can cause birth defects in the baby. If you have a cat, ask someone to clean the litter box for you. Do not douche or use tampons. Do not use scented sanitary pads. Do not use any products that contain nicotine or tobacco, such as cigarettes, e-cigarettes, and chewing tobacco. If you need help quitting, ask your health care provider. Do not use any herbal remedies, illegal drugs, or medicines that were not prescribed to you. Chemicals in these products can harm your baby. Do not drink alcohol.  You will have more frequent prenatal exams during the third trimester. During a routine prenatal visit, your health care provider will do a physical exam, perform tests, and discuss your overall health. Keep all follow-up visits. This is important. Where to find more information American Pregnancy Association: americanpregnancy.[place] and Gynecologists: PoolDevices.com.pt Office on [employer] Health: KeywordPortfolios.com.br Contact a health care provider if you have: A fever. Mild pelvic cramps, pelvic pressure, or nagging pain in your abdominal area or lower back. Vomiting or diarrhea. Bad-smelling vaginal discharge or foul-smelling urine. Pain when you urinate. A headache that does not go away when you take medicine. Visual changes or see spots in front of your eyes. Get help right away if: Your water breaks. You have regular contractions less than 5 minutes apart. You have spotting or bleeding from your vagina. You have severe abdominal pain. You have difficulty breathing. You have chest pain. You have fainting spells. You have not felt your baby move for the time period told by your health care provider. You have new or increased pain, swelling, or redness in an arm or leg. Summary The third trimester of pregnancy is from week 28 through week 40 (months 7 through 9). You may have more problems sleeping. This can be caused by the size of  your abdomen, an increased need to urinate, and an increase in your body's metabolism. You will have more frequent prenatal exams during the third trimester. Keep all follow-up visits. This is important. This information is not intended to replace advice given to you by your health care provider. Make sure you discuss any questions you have with your healthcare provider. Document Revised: 01/14/2020 Document Reviewed: 11/20/2019 Elsevier Patient Education  2022 [employer].

## 2021-04-06 NOTE — Addendum Note (Signed)
Addended by: Vernia Buff A on: 04/06/2021 04:16 PM   Modules accepted: Orders

## 2021-04-20 ENCOUNTER — Encounter: Payer: 59 | Admitting: Obstetrics & Gynecology

## 2021-04-21 ENCOUNTER — Ambulatory Visit (INDEPENDENT_AMBULATORY_CARE_PROVIDER_SITE_OTHER): Payer: BC Managed Care – PPO | Admitting: Obstetrics and Gynecology

## 2021-04-21 ENCOUNTER — Other Ambulatory Visit: Payer: Self-pay

## 2021-04-21 ENCOUNTER — Encounter: Payer: Self-pay | Admitting: Obstetrics and Gynecology

## 2021-04-21 VITALS — BP 118/74 | Wt 196.0 lb

## 2021-04-21 DIAGNOSIS — O26899 Other specified pregnancy related conditions, unspecified trimester: Secondary | ICD-10-CM

## 2021-04-21 DIAGNOSIS — Z6791 Unspecified blood type, Rh negative: Secondary | ICD-10-CM

## 2021-04-21 DIAGNOSIS — Z3A32 32 weeks gestation of pregnancy: Secondary | ICD-10-CM

## 2021-04-21 DIAGNOSIS — Z3403 Encounter for supervision of normal first pregnancy, third trimester: Secondary | ICD-10-CM

## 2021-04-21 NOTE — Progress Notes (Signed)
Routine Prenatal Care Visit  Subjective  Erika Hunt is a 20 y.o. G1P0000 at 8w4dbeing seen today for ongoing prenatal care.  She is currently monitored for the following issues for this low-risk pregnancy and has Cholecystitis; Family history of supraventricular tachycardia; GAD (generalized anxiety disorder); Encounter for supervision of normal first pregnancy in second trimester; and Rh negative state in antepartum period on their problem list.  ----------------------------------------------------------------------------------- Patient reports some left lower quadrant pressure. She notes some increase in discharge over the past 3 days. No vaginal symptoms    Contractions: Not present. Vag. Bleeding: None.  Movement: Present. Leaking Fluid denies.  ----------------------------------------------------------------------------------- The following portions of the patient's history were reviewed and updated as appropriate: allergies, current medications, past family history, past medical history, past social history, past surgical history and problem list. Problem list updated.  Objective  Blood pressure 118/74, weight 196 lb (88.9 kg), last menstrual period 08/28/2020. Pregravid weight 180 lb (81.6 kg) Total Weight Gain 16 lb (7.258 kg) Urinalysis: Urine Protein    Urine Glucose    Fetal Status: Fetal Heart Rate (bpm): 140 Fundal Height: 32 cm Movement: Present     General:  Alert, oriented and cooperative. Patient is in no acute distress.  Skin: Skin is warm and dry. No rash noted.   Cardiovascular: Normal heart rate noted  Respiratory: Normal respiratory effort, no problems with respiration noted  Abdomen: Soft, gravid, appropriate for gestational age. Pain/Pressure: Present     Pelvic:  Cervical exam deferred        Extremities: Normal range of motion.  Edema: None  Mental Status: Normal mood and affect. Normal behavior. Normal judgment and thought content.   Assessment   20 y.o. G1P0000 at 339w4dy  06/12/2021, by Ultrasound presenting for routine prenatal visit  Plan   FIRST Problems (from 12/24/20 to present)     Problem Noted Resolved   Rh negative state in antepartum period 03/21/2021 by JaWill BonnetMD No   Encounter for supervision of normal first pregnancy in second trimester 12/25/2020 by GlRod CanCNM No   Overview Addendum 04/06/2021  4:10 PM by ScHomero FellersMD     Nursing Staff Provider  Office Location  [facility] Dating  6 wk [place]  English Anatomy [place]plete  Flu Vaccine   Genetic Screen  NIPS: none  TDaP vaccine   04/06/2021 Hgb A1C or  GTT Third trimester : 111  Covid Unvaccinated Covid, Jan 2022   LAB RESULTS   Rhogam  03/21/2021 Blood Type   A negative  Feeding Plan Breast Antibody Negative (08/02 1118)  Contraception condoms Rubella    Circumcision  RPR Non Reactive (08/02 1118)   Pediatrician  [facility] HBsAg   negative  Support Person Husband: matthew HIV Non Reactive (08/02 1118)  Prenatal Classes discussed Varicella     GBS  (For PCN allergy, check sensitivities)   BTL Consent     VBAC Consent  Pap  10/18/2020- normal per patient, need records    Hgb Electro      CF      SMA                   Preterm labor symptoms and general obstetric precautions including but not limited to vaginal bleeding, contractions, leaking of fluid and fetal movement were reviewed in detail with the patient. Please refer to After Visit Summary for other counseling recommendations.   Return in about 2 weeks (around  05/05/2021) for ROB.   Prentice Docker, MD, [facility], [facility] 04/21/2021 4:52 PM

## 2021-05-03 ENCOUNTER — Encounter: Payer: Self-pay | Admitting: Obstetrics & Gynecology

## 2021-05-03 ENCOUNTER — Ambulatory Visit (INDEPENDENT_AMBULATORY_CARE_PROVIDER_SITE_OTHER): Payer: BC Managed Care – PPO | Admitting: Obstetrics & Gynecology

## 2021-05-03 ENCOUNTER — Other Ambulatory Visit: Payer: Self-pay

## 2021-05-03 VITALS — BP 100/70 | Wt 196.0 lb

## 2021-05-03 DIAGNOSIS — O26899 Other specified pregnancy related conditions, unspecified trimester: Secondary | ICD-10-CM

## 2021-05-03 DIAGNOSIS — Z3A34 34 weeks gestation of pregnancy: Secondary | ICD-10-CM

## 2021-05-03 DIAGNOSIS — Z3403 Encounter for supervision of normal first pregnancy, third trimester: Secondary | ICD-10-CM

## 2021-05-03 DIAGNOSIS — Z6791 Unspecified blood type, Rh negative: Secondary | ICD-10-CM

## 2021-05-03 NOTE — Progress Notes (Signed)
  Subjective  Fetal Movement? yes Contractions? no Leaking Fluid? no Vaginal Bleeding? no Pt feels baby is still breech She has lower pelvic pains at times Objective  BP 100/70   Wt 196 lb (88.9 kg)   LMP 08/28/2020 (Exact Date)   BMI 29.80 kg/m  General: NAD Pumonary: no increased work of breathing Abdomen: gravid, non-tender Extremities: no edema Psychiatric: mood appropriate, affect full  Assessment  20 y.o. G1P0000 at 69w2dby  06/12/2021, by Ultrasound presenting for routine prenatal visit  Plan   Problem List Items Addressed This Visit      Other   Rh negative state in antepartum period  Other Visit Diagnoses    Encounter for supervision of normal first pregnancy in third trimester    -  Primary   [redacted] weeks gestation of pregnancy        PNV, [place] option of ECV, if continues w breech then CS 39 weeks    Discussed pros and cons of CS, also pros and cons of ECV    Discussed future pregnancy and options/risks after CS Labor precautions discussed.  Counseled as to risks of PPROM w breech (umb cord prolapse).  FIRST Problems (from 12/24/20 to present)    Problem Noted Resolved   Rh negative state in antepartum period 03/21/2021 by JWill Bonnet MD No   Encounter for supervision of normal first pregnancy in second trimester 12/25/2020 by GRod Can CNM No   Overview Addendum 04/06/2021  4:10 PM by SHomero Fellers MD     Nursing Staff Provider  Office Location  [facility] Dating  6 wk [place] Language  English Anatomy [place] complete  Flu Vaccine   Genetic Screen  NIPS: none  TDaP vaccine   04/06/2021 Hgb A1C or  GTT Third trimester : 111  Covid Unvaccinated Covid, Jan 2022   LAB RESULTS   Rhogam  03/21/2021 Blood Type   A negative  Feeding Plan Breast Antibody Negative (08/02 1118)  Contraception condoms Rubella    Circumcision  RPR Non Reactive (08/02 1118)   Pediatrician  [facility] HBsAg   negative  Support Person Husband: matthew HIV Non  Reactive (08/02 1118)  Prenatal Classes discussed Varicella     GBS  (For PCN allergy, check sensitivities)   BTL Consent     VBAC Consent  Pap  10/18/2020- normal per patient, need records    Hgb Electro      CF      SMA                   PBarnett Applebaum MD, FLoura PardonOb/Gyn, CNorwalkGroup 05/03/2021  4:45 PM

## 2021-05-03 NOTE — Patient Instructions (Signed)

## 2021-05-06 ENCOUNTER — Telehealth: Payer: Self-pay

## 2021-05-06 ENCOUNTER — Observation Stay
Admission: EM | Admit: 2021-05-06 | Discharge: 2021-05-06 | Disposition: A | Discharge status: Home / Self Care | Payer: BC Managed Care – PPO | Attending: Obstetrics and Gynecology | Admitting: Obstetrics and Gynecology

## 2021-05-06 ENCOUNTER — Other Ambulatory Visit: Payer: Self-pay

## 2021-05-06 ENCOUNTER — Encounter: Payer: Self-pay | Admitting: Obstetrics and Gynecology

## 2021-05-06 DIAGNOSIS — Z3A34 34 weeks gestation of pregnancy: Secondary | ICD-10-CM | POA: Diagnosis not present

## 2021-05-06 DIAGNOSIS — R109 Unspecified abdominal pain: Secondary | ICD-10-CM | POA: Diagnosis not present

## 2021-05-06 DIAGNOSIS — Z3402 Encounter for supervision of normal first pregnancy, second trimester: Secondary | ICD-10-CM

## 2021-05-06 DIAGNOSIS — O219 Vomiting of pregnancy, unspecified: Principal | ICD-10-CM | POA: Insufficient documentation

## 2021-05-06 DIAGNOSIS — O26893 Other specified pregnancy related conditions, third trimester: Secondary | ICD-10-CM | POA: Insufficient documentation

## 2021-05-06 DIAGNOSIS — O26899 Other specified pregnancy related conditions, unspecified trimester: Secondary | ICD-10-CM

## 2021-05-06 DIAGNOSIS — Z6791 Unspecified blood type, Rh negative: Secondary | ICD-10-CM

## 2021-05-06 LAB — CBC
HCT: 30.3 % — ABNORMAL LOW (ref 36.0–46.0)
Hemoglobin: 10.6 g/dL — ABNORMAL LOW (ref 12.0–15.0)
MCH: 28.6 pg (ref 26.0–34.0)
MCHC: 35 g/dL (ref 30.0–36.0)
MCV: 81.9 fL (ref 80.0–100.0)
Platelets: 220 10*3/uL (ref 150–400)
RBC: 3.7 MIL/uL — ABNORMAL LOW (ref 3.87–5.11)
RDW: 12.9 % (ref 11.5–15.5)
WBC: 8.3 10*3/uL (ref 4.0–10.5)
nRBC: 0.2 % (ref 0.0–0.2)

## 2021-05-06 LAB — COMPREHENSIVE METABOLIC PANEL
ALT: 12 U/L (ref 0–44)
AST: 15 U/L (ref 15–41)
Albumin: 2.8 g/dL — ABNORMAL LOW (ref 3.5–5.0)
Alkaline Phosphatase: 118 U/L (ref 38–126)
Anion gap: 8 (ref 5–15)
BUN: 5 mg/dL — ABNORMAL LOW (ref 6–20)
CO2: 23 mmol/L (ref 22–32)
Calcium: 8.6 mg/dL — ABNORMAL LOW (ref 8.9–10.3)
Chloride: 104 mmol/L (ref 98–111)
Creatinine, Ser: 0.71 mg/dL (ref 0.44–1.00)
GFR, Estimated: >60 mL/min (ref >60)
Glucose, Bld: 81 mg/dL (ref 70–99)
Potassium: 3.2 mmol/L — ABNORMAL LOW (ref 3.5–5.1)
Sodium: 135 mmol/L (ref 135–145)
Total Bilirubin: 1 mg/dL (ref 0.3–1.2)
Total Protein: 5.9 g/dL — ABNORMAL LOW (ref 6.5–8.1)

## 2021-05-06 LAB — URINALYSIS, COMPLETE (UACMP) WITH MICROSCOPIC
Bilirubin Urine: NEGATIVE
Glucose, UA: NEGATIVE mg/dL
Hgb urine dipstick: NEGATIVE
Ketones, ur: 20 mg/dL — AB
Leukocytes,Ua: NEGATIVE
Nitrite: NEGATIVE
Protein, ur: NEGATIVE mg/dL
Specific Gravity, Urine: 1.012 (ref 1.005–1.030)
pH: 7 (ref 5.0–8.0)

## 2021-05-06 MED ORDER — ONDANSETRON HCL 4 MG/2ML IJ SOLN
INTRAMUSCULAR | Status: AC
  Filled 2021-05-06: qty 2

## 2021-05-06 MED ORDER — ONDANSETRON 4 MG PO TBDP: 4.0000 mg | ORAL_TABLET | Freq: Four times a day (QID) | ORAL | 3 refills | Status: DC | PRN

## 2021-05-06 MED ORDER — ONDANSETRON HCL 4 MG/2ML IJ SOLN
4.0000 mg | Freq: Four times a day (QID) | INTRAMUSCULAR | Status: DC | PRN
  Administered 2021-05-06: 4 mg via INTRAVENOUS

## 2021-05-06 MED ORDER — LACTATED RINGERS IV BOLUS
500.0000 mL | Freq: Once | INTRAVENOUS | Status: AC
  Administered 2021-05-06: 500 mL via INTRAVENOUS

## 2021-05-06 MED ORDER — POTASSIUM CHLORIDE 20 MEQ PO PACK
60.0000 meq | PACK | Freq: Once | ORAL | Status: AC
  Administered 2021-05-06: 60 meq via ORAL
  Filled 2021-05-06: qty 3

## 2021-05-06 MED ORDER — LACTATED RINGERS IV SOLN
INTRAVENOUS | Status: DC
  Administered 2021-05-06: 500 mL via INTRAVENOUS

## 2021-05-06 NOTE — Telephone Encounter (Signed)
Pt calling; is 35wks; has been able to keep food down today; cramping is pretty bad too.  [phone] Pt is currently adv to [facility] L&D.  Adv [facility] that's where she needs to be.

## 2021-05-06 NOTE — OB Triage Note (Signed)
Pt Erika Hunt 19 y.o. presents to labor and delivery triage reporting contractions and upper and lower abdominal pain 9/10. Pt is a G1P0000 at [redacted]w[redacted]d. Pt denies signs and symptoms consistent with rupture of membranes or active vaginal bleeding. Pt has occasional contractions and states positive fetal movement. External FM and TOCO applied to non-tender abdomen and assessing. Initial FHR 150. Vital signs obtained and within normal limits. Provider notified of pt.

## 2021-05-06 NOTE — Progress Notes (Signed)
OB History & Physical   History of Present Illness:  Chief Complaint: abdominal pain, nausea/vomiting  HPI:  Erika Hunt is a 20 y.o. G1P0000 female at 35w5ddated by 6 week ultrasound.  Her pregnancy has been complicated by history of cholecystectomy, generalized anxiety disorder, nausea and vomiting, Rh negative.    She reports cramping.   She denies leakage of fluid.   She denies vaginal bleeding.   She reports fetal movement.    Around noon today she began having sharp abdominal pain- epigastric and mid left abdomen along with lower abdomen cramping. Also since that time she has been unable to tolerate PO. She was admitted for observation, placed on monitors, IV fluids started, labs collected. She reports baby was breech at last check.  Total weight gain for pregnancy: 7.258 kg   Obstetrical Problem List: FIRST Problems (from 12/24/20 to present)     Problem Noted Resolved   Rh negative state in antepartum period 03/21/2021 by JWill Bonnet MD No   Encounter for supervision of normal first pregnancy in second trimester 12/25/2020 by GRod Can CNM No   Overview Addendum 04/06/2021  4:10 PM by SHomero Fellers MD     Nursing Staff Provider  Office Location  [facility] Dating  6 wk [place] Language  English Anatomy [place] complete  Flu Vaccine   Genetic Screen  NIPS: none  TDaP vaccine   04/06/2021 Hgb A1C or  GTT Third trimester : 111  Covid Unvaccinated Covid, Jan 2022   LAB RESULTS   Rhogam  03/21/2021 Blood Type   A negative  Feeding Plan Breast Antibody Negative (08/02 1118)  Contraception condoms Rubella    Circumcision  RPR Non Reactive (08/02 1118)   Pediatrician  [facility] HBsAg   negative  Support Person Husband: matthew HIV Non Reactive (08/02 1118)  Prenatal Classes discussed Varicella     GBS  (For PCN allergy, check sensitivities)   BTL Consent     VBAC Consent  Pap  10/18/2020- normal per patient, need records    Hgb Electro      CF      SMA                    Maternal Medical History:   Past Medical History:  Diagnosis Date   Elevated liver enzymes    Family history of adverse reaction to anesthesia    PT'S MOM STATES IT TAKES MORE TO SEDATE HER FOR HER PAST SURGERIES    Past Surgical History:  Procedure Laterality Date   CHOLECYSTECTOMY N/A 06/09/2016   Procedure: LAPAROSCOPIC CHOLECYSTECTOMY WITH INTRAOPERATIVE CHOLANGIOGRAM;  Surgeon: JRobert Bellow MD;  Location: [facility] ORS;  Service: General;  Laterality: N/A;   TEAR DUCT PROBING  2003   WISDOM TOOTH EXTRACTION      Allergies  Allergen Reactions   Penicillins Hives    Has patient had a PCN reaction causing immediate rash, facial/tongue/throat swelling, SOB or lightheadedness with hypotension: Yes Has patient had a PCN reaction causing severe rash involving mucus membranes or skin necrosis: No Has patient had a PCN reaction that required hospitalization No Has patient had a PCN reaction occurring within the last 10 years: Yes If all of the above answers are "NO", then may proceed with Cephalosporin use. Has patient had a PCN reaction causing immediate rash, facial/tongue/throat swelling, SOB or lightheadedness with hypotension: Yes Has patient had a PCN reaction causing severe rash involving mucus membranes or skin necrosis: No  Has patient had a PCN reaction that required hospitalization No Has patient had a PCN reaction occurring within the last 10 years: Yes If all of the above answers are "NO", then may proceed with Cephalosporin use.     Prior to Admission medications   Medication Sig Start Date End Date Taking? Authorizing Provider  Doxylamine Succinate, Sleep, (UNISOM PO) Take by mouth.    [provider]  famotidine (PEPCID) 10 MG tablet Take 10 mg by mouth 2 (two) times daily.    [provider]  Prenatal Vit-Fe Fumarate-FA (PRENATAL PO) Take by mouth.    [provider]    OB History  Gravida Para Term Preterm AB Living   1 0 0 0 0 0  SAB IAB Ectopic Multiple Live Births  0 0 0 0      # Outcome Date GA Lbr Len/2nd Weight Sex Delivery Anes PTL Lv  1 Current             Obstetric Comments  1st Menstrual Cycle:  9    Prenatal care site: [facility]  Social History: She  reports that she has never smoked. She has never used smokeless tobacco. She reports that she does not drink alcohol and does not use drugs.  Family History: family history includes Arthritis in her maternal grandmother; Breast cancer (age of onset: 25) in her maternal aunt; Breast cancer (age of onset: 68) in her paternal aunt; Diabetes in her paternal grandfather; Diabetes Mellitus II in her maternal grandfather; Hyperlipidemia in her maternal grandfather.    Review of Systems:  Review of Systems  Constitutional:  Negative for chills and fever.  HENT:  Negative for congestion, ear discharge, ear pain, hearing loss, sinus pain and sore throat.   Eyes:  Negative for blurred vision and double vision.  Respiratory:  Negative for cough, shortness of breath and wheezing.   Cardiovascular:  Negative for chest pain, palpitations and leg swelling.  Gastrointestinal:  Positive for abdominal pain, nausea and vomiting. Negative for blood in stool, constipation, diarrhea, heartburn and melena.  Genitourinary:  Negative for dysuria, flank pain, frequency, hematuria and urgency.  Musculoskeletal:  Negative for back pain, joint pain and myalgias.  Skin:  Negative for itching and rash.  Neurological:  Negative for dizziness, tingling, tremors, sensory change, speech change, focal weakness, seizures, loss of consciousness, weakness and headaches.  Endo/Heme/Allergies:  Negative for environmental allergies. Does not bruise/bleed easily.  Psychiatric/Behavioral:  Negative for depression, hallucinations, memory loss, substance abuse and suicidal ideas. The patient is not nervous/anxious and does not have insomnia.     Physical Exam:  BP 107/71 (BP  Location: Left Arm)   Pulse 99   Temp 98.8 F (37.1 C) (Oral)   Resp 16   LMP 08/28/2020 (Exact Date)   Constitutional: Well nourished, well developed female in no acute distress.  HEENT: normal Skin: Warm and dry.  Cardiovascular: Regular rate and rhythm.   Extremity:  no edema   Respiratory: Clear to auscultation bilateral. Normal respiratory effort Abdomen: FHT present, mild tenderness to palpation Back: no CVAT Neuro: DTRs 2+, Cranial nerves grossly intact Psych: Alert and Oriented x3. No memory deficits. Normal mood and affect.  MS: normal gait, normal bilateral lower extremity ROM/strength/stability.  Pelvic exam: per RN Erin Fulling Closed/thick/ballotable/posterior   Baseline FHR: 135 beats/min   Variability: moderate   Accelerations: present   Decelerations: absent Contractions: irritability Overall assessment: reassuring   No results found for: SARSCOV2NAA]  Assessment:  Jerrye Beavers  is a 20 y.o. G1P0000 female at 66w5dwith abdominal pain, nausea and vomiting.   Plan:  Admit to Labor & Delivery for observation CBC, CMP, UA, IV fluids Report to incoming provider Dr SBascom Levels CNM 05/06/2021 5:13 PM

## 2021-05-06 NOTE — Discharge Summary (Signed)
Physician Discharge Summary  Patient ID: Erika Hunt MRN: [id number] DOB/AGE: 22-15-2002 22 y.o.  Admit date: 05/06/2021 Discharge date: 05/06/2021  Admission Diagnoses:  Discharge Diagnoses:  Active Problems:   Labor and delivery, indication for care   Discharged Condition: good  Hospital Course: She was admitted for crampy abdominal pain. Reports multiple episodes of emesis today. She was hydrated and her potassium was replaced. After this she felt much improved and was able to be discharged home. No signs of labor. Closed cervical exam by midwife.   Consults:  none  Significant Diagnostic Studies: labs: See EPIC  Treatments: IV hydration  Discharge Exam: Blood pressure 122/71, pulse 87, temperature 98.2 F (36.8 C), temperature source Oral, resp. rate 16, last menstrual period 08/28/2020. General appearance: alert and cooperative GI: soft, non-tender; bowel sounds normal; no masses,  no organomegaly Skin: Skin color, texture, turgor normal. No rashes or lesions Neurologic: Grossly normal  Disposition: Discharge disposition: 01-Home or Self Care       Discharge Instructions     Discharge patient   Complete by: As directed    Discharge disposition: 01-Home or Self Care   Discharge patient date: 05/06/2021      Allergies as of 05/06/2021       Reactions   Penicillins Hives   Has patient had a PCN reaction causing immediate rash, facial/tongue/throat swelling, SOB or lightheadedness with hypotension: Yes Has patient had a PCN reaction causing severe rash involving mucus membranes or skin necrosis: No Has patient had a PCN reaction that required hospitalization No Has patient had a PCN reaction occurring within the last 10 years: Yes If all of the above answers are "NO", then may proceed with Cephalosporin use. Has patient had a PCN reaction causing immediate rash, facial/tongue/throat swelling, SOB or lightheadedness with hypotension: Yes Has patient had a PCN  reaction causing severe rash involving mucus membranes or skin necrosis: No Has patient had a PCN reaction that required hospitalization No Has patient had a PCN reaction occurring within the last 10 years: Yes If all of the above answers are "NO", then may proceed with Cephalosporin use.        Medication List     TAKE these medications    famotidine 10 MG tablet Commonly known as: PEPCID Take 10 mg by mouth 2 (two) times daily.   ondansetron 4 MG disintegrating tablet Commonly known as: Zofran ODT Take 1 tablet (4 mg total) by mouth every 6 (six) hours as needed for nausea.   PRENATAL PO Take by mouth.   UNISOM PO Take by mouth.         Signed: Homero Fellers 05/06/2021, 8:07 PM

## 2021-05-06 NOTE — OB Triage Note (Signed)
Discharge instructions reviewed per MD order. Medication and prescriptions discussed and pt verbalized understanding with all questions answered. Pt stable at the time of discharge home with her significant other. Follow-up care reviewed with understanding.

## 2021-05-11 NOTE — Telephone Encounter (Signed)
[facility] calling; they have not recv'd faxed rx for breast pump; please refax to [phone]; if possible, also send thru pt's MyChart.  [phone]

## 2021-05-14 ENCOUNTER — Other Ambulatory Visit: Payer: Self-pay

## 2021-05-14 ENCOUNTER — Observation Stay (HOSPITAL_BASED_OUTPATIENT_CLINIC_OR_DEPARTMENT_OTHER)
Admission: EM | Admit: 2021-05-14 | Discharge: 2021-05-14 | Disposition: A | Discharge status: Home / Self Care | Payer: BC Managed Care – PPO | Source: Home / Self Care | Admitting: Obstetrics and Gynecology

## 2021-05-14 ENCOUNTER — Encounter: Payer: Self-pay | Admitting: Obstetrics and Gynecology

## 2021-05-14 ENCOUNTER — Observation Stay: Payer: BC Managed Care – PPO

## 2021-05-14 DIAGNOSIS — Z3403 Encounter for supervision of normal first pregnancy, third trimester: Secondary | ICD-10-CM

## 2021-05-14 DIAGNOSIS — O26893 Other specified pregnancy related conditions, third trimester: Secondary | ICD-10-CM | POA: Insufficient documentation

## 2021-05-14 DIAGNOSIS — Z6791 Unspecified blood type, Rh negative: Secondary | ICD-10-CM

## 2021-05-14 DIAGNOSIS — Z3A35 35 weeks gestation of pregnancy: Secondary | ICD-10-CM

## 2021-05-14 DIAGNOSIS — R109 Unspecified abdominal pain: Secondary | ICD-10-CM | POA: Insufficient documentation

## 2021-05-14 DIAGNOSIS — O321XX0 Maternal care for breech presentation, not applicable or unspecified: Secondary | ICD-10-CM

## 2021-05-14 DIAGNOSIS — Z3402 Encounter for supervision of normal first pregnancy, second trimester: Secondary | ICD-10-CM

## 2021-05-14 DIAGNOSIS — O329XX0 Maternal care for malpresentation of fetus, unspecified, not applicable or unspecified: Secondary | ICD-10-CM | POA: Insufficient documentation

## 2021-05-14 DIAGNOSIS — O26899 Other specified pregnancy related conditions, unspecified trimester: Secondary | ICD-10-CM

## 2021-05-14 LAB — URINALYSIS, COMPLETE (UACMP) WITH MICROSCOPIC
Bilirubin Urine: NEGATIVE
Glucose, UA: NEGATIVE mg/dL
Hgb urine dipstick: NEGATIVE
Ketones, ur: 5 mg/dL — AB
Leukocytes,Ua: NEGATIVE
Nitrite: NEGATIVE
Protein, ur: NEGATIVE mg/dL
Specific Gravity, Urine: 1.011 (ref 1.005–1.030)
pH: 7 (ref 5.0–8.0)

## 2021-05-14 LAB — COMPREHENSIVE METABOLIC PANEL
ALT: 14 U/L (ref 0–44)
AST: 18 U/L (ref 15–41)
Albumin: 3 g/dL — ABNORMAL LOW (ref 3.5–5.0)
Alkaline Phosphatase: 165 U/L — ABNORMAL HIGH (ref 38–126)
Anion gap: 11 (ref 5–15)
BUN: 5 mg/dL — ABNORMAL LOW (ref 6–20)
CO2: 22 mmol/L (ref 22–32)
Calcium: 8.8 mg/dL — ABNORMAL LOW (ref 8.9–10.3)
Chloride: 104 mmol/L (ref 98–111)
Creatinine, Ser: 0.64 mg/dL (ref 0.44–1.00)
GFR, Estimated: >60 mL/min (ref >60)
Glucose, Bld: 92 mg/dL (ref 70–99)
Potassium: 3.6 mmol/L (ref 3.5–5.1)
Sodium: 137 mmol/L (ref 135–145)
Total Bilirubin: 1 mg/dL (ref 0.3–1.2)
Total Protein: 6.2 g/dL — ABNORMAL LOW (ref 6.5–8.1)

## 2021-05-14 LAB — CHLAMYDIA/NGC RT PCR (ARMC ONLY)
Chlamydia Tr: NOT DETECTED
N gonorrhoeae: NOT DETECTED

## 2021-05-14 LAB — CBC
HCT: 33.9 % — ABNORMAL LOW (ref 36.0–46.0)
Hemoglobin: 11.5 g/dL — ABNORMAL LOW (ref 12.0–15.0)
MCH: 27.6 pg (ref 26.0–34.0)
MCHC: 33.9 g/dL (ref 30.0–36.0)
MCV: 81.3 fL (ref 80.0–100.0)
Platelets: 268 10*3/uL (ref 150–400)
RBC: 4.17 MIL/uL (ref 3.87–5.11)
RDW: 13.1 % (ref 11.5–15.5)
WBC: 12.1 10*3/uL — ABNORMAL HIGH (ref 4.0–10.5)
nRBC: 0 % (ref 0.0–0.2)

## 2021-05-14 LAB — URINE DRUG SCREEN, QUALITATIVE (ARMC ONLY)
Amphetamines, Ur Screen: NOT DETECTED
Barbiturates, Ur Screen: NOT DETECTED
Benzodiazepine, Ur Scrn: NOT DETECTED
Cannabinoid 50 Ng, Ur ~~LOC~~: NOT DETECTED
Cocaine Metabolite,Ur ~~LOC~~: NOT DETECTED
MDMA (Ecstasy)Ur Screen: NOT DETECTED
Methadone Scn, Ur: NOT DETECTED
Opiate, Ur Screen: NOT DETECTED
Phencyclidine (PCP) Ur S: NOT DETECTED
Tricyclic, Ur Screen: NOT DETECTED

## 2021-05-14 LAB — LACTIC ACID, PLASMA: Lactic Acid, Venous: 0.8 mmol/L (ref 0.5–1.9)

## 2021-05-14 LAB — LIPASE, BLOOD: Lipase: 25 U/L (ref 11–51)

## 2021-05-14 LAB — ABO/RH: ABO/RH(D): A NEG

## 2021-05-14 MED ORDER — HYDROMORPHONE HCL 1 MG/ML IJ SOLN
0.5000 mg | INTRAMUSCULAR | Status: AC
  Administered 2021-05-14: 0.5 mg via INTRAVENOUS
  Filled 2021-05-14: qty 1

## 2021-05-14 MED ORDER — LACTATED RINGERS IV SOLN: INTRAVENOUS | Status: DC

## 2021-05-14 MED ORDER — FENTANYL CITRATE (PF) 100 MCG/2ML IJ SOLN: 25.0000 ug | INTRAMUSCULAR | Status: DC | PRN

## 2021-05-14 NOTE — Progress Notes (Signed)
Pt discharged home per her and her husband's request. IV removed, dsg applied. Discharge instructions given. Labor precautions reviewed. Cesarean section scheduled for October 18th for breech presentation. Left floor via wheelchair. Erika Hunt

## 2021-05-14 NOTE — Discharge Summary (Signed)
See Final Progress Note

## 2021-05-14 NOTE — Final Progress Note (Signed)
Physician Final Progress Note  Patient ID: Erika Hunt MRN: [id number] DOB/AGE: 10-Feb-2001 20 y.o.  Admit date: 05/14/2021 Admitting provider: Will Bonnet, MD Discharge date: 05/14/2021   Admission Diagnoses:  1) intrauterine pregnancy at [redacted]w[redacted]d  2) Abdominal pain in pregnancy, third trimester 3) malpresentation of fetus before onset of labor, unspecified fetus.  Discharge Diagnoses:  Principal Problem:   Abdominal pain during pregnancy in third trimester Active Problems:   Encounter for supervision of normal first pregnancy in third trimester   Rh negative state in antepartum period   [redacted] weeks gestation of pregnancy   Malpresentation before onset of labor, not applicable or unspecified fetus    History of Present Illness: The patient is a 20 y.o. female G1P0000 at [redacted]w[redacted]d who presents for acute onset abdominal pain.  She started having abdominal pain yesterday. She was able to tolerate it and even eat and drink yesterday evening.  She did have one episode of emesis last night, though she states that this has been normal for her.  She woke early this morning with left lower quadrant pain that then also started hurting in her epigastric area.  Overall, she states her entire abdomen hurts.  She denies fevers, chills, current nausea and vomiting, urinary symptoms, hematuria, diarrhea, hematochezia/melena, vaginal irritative symptoms of itching,burning, and irritation.  She notes no trauma.  Nothing makes the pain better. Movement makes the pain worse.  She rates the pain as 9.5/10.  She notes +FM, no LOF, no vaginal bleeding, and no contractions.   Past Medical History:  Diagnosis Date   Elevated liver enzymes    Family history of adverse reaction to anesthesia    PT'S MOM STATES IT TAKES MORE TO SEDATE HER FOR HER PAST SURGERIES    Past Surgical History:  Procedure Laterality Date   CHOLECYSTECTOMY N/A 06/09/2016   Procedure: LAPAROSCOPIC CHOLECYSTECTOMY WITH INTRAOPERATIVE  CHOLANGIOGRAM;  Surgeon: Robert Bellow, MD;  Location: [facility] ORS;  Service: General;  Laterality: N/A;   TEAR DUCT PROBING  2003   WISDOM TOOTH EXTRACTION      No current facility-administered medications on file prior to encounter.   Current Outpatient Medications on File Prior to Encounter  Medication Sig Dispense Refill   Doxylamine Succinate, Sleep, (UNISOM PO) Take by mouth.     famotidine (PEPCID) 10 MG tablet Take 10 mg by mouth 2 (two) times daily.     ondansetron (ZOFRAN ODT) 4 MG disintegrating tablet Take 1 tablet (4 mg total) by mouth every 6 (six) hours as needed for nausea. 120 tablet 3   Prenatal Vit-Fe Fumarate-FA (PRENATAL PO) Take by mouth.      Allergies  Allergen Reactions   Penicillins Hives    Has patient had a PCN reaction causing immediate rash, facial/tongue/throat swelling, SOB or lightheadedness with hypotension: Yes Has patient had a PCN reaction causing severe rash involving mucus membranes or skin necrosis: No Has patient had a PCN reaction that required hospitalization No Has patient had a PCN reaction occurring within the last 10 years: Yes If all of the above answers are "NO", then may proceed with Cephalosporin use. Has patient had a PCN reaction causing immediate rash, facial/tongue/throat swelling, SOB or lightheadedness with hypotension: Yes Has patient had a PCN reaction causing severe rash involving mucus membranes or skin necrosis: No Has patient had a PCN reaction that required hospitalization No Has patient had a PCN reaction occurring within the last 10 years: Yes If all of the above answers are "NO", then  may proceed with Cephalosporin use.     Social History   Socioeconomic History   Marital status: Married    Spouse name: Rodman Key   Number of children: Not on file   Years of education: Not on file   Highest education level: Not on file  Occupational History   Not on file  Tobacco Use   Smoking status: Never   Smokeless  tobacco: Never  Vaping Use   Vaping Use: Never used  Substance and Sexual Activity   Alcohol use: No    Alcohol/week: 0.0 standard drinks   Drug use: No   Sexual activity: Yes    Birth control/protection: None  Other Topics Concern   Not on file  Social History Narrative   11th grade - [employer]      One brother      Plays basketball      Social Determinants of Health   Financial Resource Strain: Not on file  Food Insecurity: Not on file  Transportation Needs: Not on file  Physical Activity: Not on file  Stress: Not on file  Social Connections: Not on file  Intimate Partner Violence: Not on file    Family History  Problem Relation Age of Onset   Breast cancer Maternal Aunt 40       times two great aunts   Breast cancer Paternal Aunt 27       one    Arthritis Maternal Grandmother    Hyperlipidemia Maternal Grandfather    Diabetes Mellitus II Maternal Grandfather    Diabetes Paternal Grandfather      Review of Systems  Constitutional: Negative.  Negative for chills, fever and malaise/fatigue.  HENT: Negative.    Eyes: Negative.   Respiratory: Negative.  Negative for cough.   Cardiovascular: Negative.  Negative for chest pain.  Gastrointestinal:  Positive for vomiting (last night). Negative for abdominal pain, blood in stool, constipation (last BM two days ago (normal for her)), diarrhea, heartburn, melena and nausea.  Genitourinary: Negative.  Negative for dysuria, flank pain, frequency, hematuria and urgency.  Musculoskeletal: Negative.   Skin: Negative.   Neurological: Negative.   Psychiatric/Behavioral: Negative.      Physical Exam: BP 116/73 (BP Location: Right Arm)   Pulse 94   Temp 98.8 F (37.1 C) (Oral)   Resp 18   Ht 5\' 8"  (1.727 m)   Wt 88.9 kg   LMP 08/28/2020 (Exact Date)   BMI 29.80 kg/m   Physical Exam Constitutional:      General: She is not in acute distress.    Appearance: Normal appearance. She is well-developed.   Genitourinary:     Genitourinary Comments: 1/50/-3/posterior (per RN x 2)  HENT:     Head: Normocephalic and atraumatic.  Eyes:     General: No scleral icterus.    Conjunctiva/sclera: Conjunctivae normal.  Cardiovascular:     Rate and Rhythm: Normal rate and regular rhythm.     Heart sounds: No murmur heard.   No friction rub. No gallop.  Pulmonary:     Effort: Pulmonary effort is normal. No respiratory distress.     Breath sounds: Normal breath sounds. No wheezing or rales.  Abdominal:     General: Bowel sounds are normal. There is no distension.     Palpations: Abdomen is soft. There is mass (gravid uterus).     Tenderness: There is abdominal tenderness (generall tender to palpation, mild-moderate). There is no right CVA tenderness, left CVA tenderness, guarding or rebound.  Musculoskeletal:        General: Normal range of motion.     Cervical back: Normal range of motion and neck supple.  Neurological:     General: No focal deficit present.     Mental Status: She is alert and oriented to person, place, and time.     Cranial Nerves: No cranial nerve deficit.  Skin:    General: Skin is warm and dry.     Findings: No erythema.  Psychiatric:        Mood and Affect: Mood normal.        Behavior: Behavior normal.        Judgment: Judgment normal.    Consults: no formal consultation. I did speak with Dr. Donalee Citrin of [facility] regarding her pain without obvious etiology.  He offered no new recommendations to management and investigation.   Significant Findings/ Diagnostic Studies:  Lactic acid: 0.8 (nml) Lipase: 25 (nml) Gonorrhea/chlamydia: negative/negative UDS: negative UA: negative (no blood, neg nitrite, neg LE) CMP: essentially normal (normal creatinine, LFTs) CBC: WBC 12.1, hemoglobin 11.5, hematocrit 33.9, platelets 268  Procedures:  NST:  Baseline FHR: 135 beats/min Variability: moderate Accelerations: present Decelerations: absent Tocometry: irritability    Interpretation:  INDICATIONS: rule out uterine contractions RESULTS:  A NST procedure was performed with FHR monitoring and a normal baseline established, appropriate time of 20-40 minutes of evaluation, and accels >2 seen w 15x15 characteristics.  Results show a REACTIVE NST.    Imaging Results MR PELVIS WO CONTRAST  Result Date: 05/14/2021 CLINICAL DATA:  Pregnant abdominal pain, constant pain on both the right and left side of the abdomen EXAM: MRI ABDOMEN AND PELVIS WITHOUT CONTRAST TECHNIQUE: Multiplanar multisequence MR imaging of the abdomen and pelvis was performed. No intravenous contrast was administered. COMPARISON:  None. FINDINGS: COMBINED FINDINGS FOR BOTH MR ABDOMEN AND PELVIS Lower chest: No acute findings. Hepatobiliary: No mass or other parenchymal abnormality identified. Status post cholecystectomy. No biliary ductal dilatation. Pancreas: No mass, inflammatory changes, or other parenchymal abnormality identified. Spleen:  Within normal limits in size and appearance. Adrenals/Urinary Tract: No masses identified. Moderate right hydronephrosis and hydroureter to the level of the uterus (series 12, image 35). Stomach/Bowel: Visualized portions within the abdomen are unremarkable. Normal appendix (series 9, image 74). Vascular/Lymphatic: No pathologically enlarged lymph nodes identified. No abdominal aortic aneurysm demonstrated. Reproductive: Advanced intrauterine gestation. Fundal and posterior placenta. Incidental note of nuchal cord. Grossly normal appearance of the fetus on this examination, which is not tailored for the evaluation fetal anatomy. Other:  Trace functional fluid throughout the pelvis. Musculoskeletal: No suspicious bone lesions identified. IMPRESSION: 1. Moderate right hydronephrosis and hydroureter to the level of the uterus, consistent with gestational hydronephrosis in the setting of advanced pregnancy. 2. No other noncontrast findings of the abdomen or pelvis to  explain pain. 3. Advanced intrauterine gestation. Fundal and posterior placenta. Grossly normal appearance of the fetus on this examination, which is not tailored for the evaluation fetal anatomy. 4. Incidental note of nuchal cord, which may be better evaluated by ultrasound if desired. Electronically Signed   By: Eddie Candle M.D.   On: 05/14/2021 14:21   MR ABDOMEN WO CONTRAST  Result Date: 05/14/2021 CLINICAL DATA:  Pregnant abdominal pain, constant pain on both the right and left side of the abdomen EXAM: MRI ABDOMEN AND PELVIS WITHOUT CONTRAST TECHNIQUE: Multiplanar multisequence MR imaging of the abdomen and pelvis was performed. No intravenous contrast was administered. COMPARISON:  None. FINDINGS: COMBINED FINDINGS FOR BOTH MR ABDOMEN  AND PELVIS Lower chest: No acute findings. Hepatobiliary: No mass or other parenchymal abnormality identified. Status post cholecystectomy. No biliary ductal dilatation. Pancreas: No mass, inflammatory changes, or other parenchymal abnormality identified. Spleen:  Within normal limits in size and appearance. Adrenals/Urinary Tract: No masses identified. Moderate right hydronephrosis and hydroureter to the level of the uterus (series 12, image 35). Stomach/Bowel: Visualized portions within the abdomen are unremarkable. Normal appendix (series 9, image 74). Vascular/Lymphatic: No pathologically enlarged lymph nodes identified. No abdominal aortic aneurysm demonstrated. Reproductive: Advanced intrauterine gestation. Fundal and posterior placenta. Incidental note of nuchal cord. Grossly normal appearance of the fetus on this examination, which is not tailored for the evaluation fetal anatomy. Other:  Trace functional fluid throughout the pelvis. Musculoskeletal: No suspicious bone lesions identified. IMPRESSION: 1. Moderate right hydronephrosis and hydroureter to the level of the uterus, consistent with gestational hydronephrosis in the setting of advanced pregnancy. 2. No  other noncontrast findings of the abdomen or pelvis to explain pain. 3. Advanced intrauterine gestation. Fundal and posterior placenta. Grossly normal appearance of the fetus on this examination, which is not tailored for the evaluation fetal anatomy. 4. Incidental note of nuchal cord, which may be better evaluated by ultrasound if desired. Electronically Signed   By: Eddie Candle M.D.   On: 05/14/2021 14:21     Hospital Course: The patient was admitted to Labor and Delivery Triage for observation. She had normal vital signs, normal fetal monitoring (reactive tracing). Her cervical exam was 1 cm and did not change after several hours.  She had no labs (as seen above) to indicate an etiology. She had an MRI that did not indicate a source of pain. There was no evidence of placental abruption on exam, bedside ultrasound, and MRI.  The MRI also did not show any other source of pain.  She had a bedside u/s that did confirm continued complete breech of the fetus. She had previously been counseled regarding ECV versus expectant management and need for c-section, if the fetus did not turn. She has previously declined ECV and I did not offer her one today due to her gestational age and significant pain report.  After finding no source of pain, I offered to monitor her overnight.  She initially accepted and then declined.  Given I could find nothing of concern apart from her reported pain, I granted her discharge on the condition she return if the pain did not subside and, especially, if the pain became worse.  She was scheduled for primary c-section on 06/07/21, due to breech presentation.  She has follow up in a few days and was encouraged to keep that appointment.   Discharge Condition: unchanged  Disposition: Discharge disposition: 01-Home or Self Care      Diet: Regular diet  Discharge Activity: Activity as tolerated   Allergies as of 05/14/2021       Reactions   Penicillins Hives   Has patient had a  PCN reaction causing immediate rash, facial/tongue/throat swelling, SOB or lightheadedness with hypotension: Yes Has patient had a PCN reaction causing severe rash involving mucus membranes or skin necrosis: No Has patient had a PCN reaction that required hospitalization No Has patient had a PCN reaction occurring within the last 10 years: Yes If all of the above answers are "NO", then may proceed with Cephalosporin use. Has patient had a PCN reaction causing immediate rash, facial/tongue/throat swelling, SOB or lightheadedness with hypotension: Yes Has patient had a PCN reaction causing severe rash involving mucus membranes  or skin necrosis: No Has patient had a PCN reaction that required hospitalization No Has patient had a PCN reaction occurring within the last 10 years: Yes If all of the above answers are "NO", then may proceed with Cephalosporin use.        Medication List     TAKE these medications    famotidine 10 MG tablet Commonly known as: PEPCID Take 10 mg by mouth 2 (two) times daily.   ondansetron 4 MG disintegrating tablet Commonly known as: Zofran ODT Take 1 tablet (4 mg total) by mouth every 6 (six) hours as needed for nausea.   PRENATAL PO Take by mouth.   UNISOM PO Take by mouth.        [place]. Go on 05/17/2021.   Specialty: Obstetrics and Gynecology Why: previously scheduled routine prenatal appointment Contact information: [address] [place] [address] [phone]                Total time spent taking care of this patient: 75 minutes  Signed: Prentice Docker, MD  05/14/2021, 5:41 PM

## 2021-05-14 NOTE — OB Triage Note (Signed)
Pt co abdominal pain starting at around 1630 yesterday. Got better yesterday evening around 1900 the started back at approx 0500 this am. Pt states pain in abdomen is constant on both right and left side. Denies vaginal bleeding. Denies any LOF. Reports + fetal movement. Upon placing fetaal monitors RN noticed abdomen tender to touch. Erika Hunt

## 2021-05-15 ENCOUNTER — Inpatient Hospital Stay
Admission: EM | Admit: 2021-05-15 | Discharge: 2021-05-17 | DRG: 787 | Disposition: A | Discharge status: Home / Self Care | Payer: BC Managed Care – PPO | Attending: Obstetrics and Gynecology | Admitting: Obstetrics and Gynecology

## 2021-05-15 ENCOUNTER — Inpatient Hospital Stay: Payer: BC Managed Care – PPO | Admitting: Certified Registered"

## 2021-05-15 ENCOUNTER — Encounter: Payer: Self-pay | Admitting: Obstetrics and Gynecology

## 2021-05-15 ENCOUNTER — Encounter
Admission: EM | Disposition: A | Discharge status: Home / Self Care | Payer: Self-pay | Source: Home / Self Care | Attending: Obstetrics and Gynecology

## 2021-05-15 DIAGNOSIS — Z3A36 36 weeks gestation of pregnancy: Secondary | ICD-10-CM

## 2021-05-15 DIAGNOSIS — O42913 Preterm premature rupture of membranes, unspecified as to length of time between rupture and onset of labor, third trimester: Secondary | ICD-10-CM | POA: Diagnosis present

## 2021-05-15 DIAGNOSIS — O328XX0 Maternal care for other malpresentation of fetus, not applicable or unspecified: Secondary | ICD-10-CM | POA: Diagnosis present

## 2021-05-15 DIAGNOSIS — O9081 Anemia of the puerperium: Secondary | ICD-10-CM | POA: Diagnosis not present

## 2021-05-15 DIAGNOSIS — Z23 Encounter for immunization: Secondary | ICD-10-CM | POA: Diagnosis not present

## 2021-05-15 DIAGNOSIS — O3403 Maternal care for unspecified congenital malformation of uterus, third trimester: Secondary | ICD-10-CM | POA: Diagnosis present

## 2021-05-15 DIAGNOSIS — Z3403 Encounter for supervision of normal first pregnancy, third trimester: Secondary | ICD-10-CM

## 2021-05-15 DIAGNOSIS — O329XX0 Maternal care for malpresentation of fetus, unspecified, not applicable or unspecified: Secondary | ICD-10-CM | POA: Diagnosis present

## 2021-05-15 DIAGNOSIS — Z20822 Contact with and (suspected) exposure to covid-19: Secondary | ICD-10-CM | POA: Diagnosis present

## 2021-05-15 DIAGNOSIS — O42013 Preterm premature rupture of membranes, onset of labor within 24 hours of rupture, third trimester: Secondary | ICD-10-CM | POA: Diagnosis not present

## 2021-05-15 DIAGNOSIS — O26893 Other specified pregnancy related conditions, third trimester: Secondary | ICD-10-CM | POA: Diagnosis present

## 2021-05-15 DIAGNOSIS — D62 Acute posthemorrhagic anemia: Secondary | ICD-10-CM | POA: Diagnosis not present

## 2021-05-15 DIAGNOSIS — Z6791 Unspecified blood type, Rh negative: Secondary | ICD-10-CM

## 2021-05-15 DIAGNOSIS — O26899 Other specified pregnancy related conditions, unspecified trimester: Secondary | ICD-10-CM

## 2021-05-15 DIAGNOSIS — O321XX0 Maternal care for breech presentation, not applicable or unspecified: Secondary | ICD-10-CM | POA: Diagnosis not present

## 2021-05-15 DIAGNOSIS — Z880 Allergy status to penicillin: Secondary | ICD-10-CM | POA: Diagnosis not present

## 2021-05-15 DIAGNOSIS — Q51810 Arcuate uterus: Secondary | ICD-10-CM

## 2021-05-15 LAB — RESP PANEL BY RT-PCR (FLU A&B, COVID) ARPGX2
Influenza A by PCR: NEGATIVE
Influenza B by PCR: NEGATIVE
SARS Coronavirus 2 by RT PCR: NEGATIVE

## 2021-05-15 LAB — CBC WITH DIFFERENTIAL/PLATELET
Abs Immature Granulocytes: 0.07 10*3/uL (ref 0.00–0.07)
Basophils Absolute: 0 10*3/uL (ref 0.0–0.1)
Basophils Relative: 0 %
Eosinophils Absolute: 0.1 10*3/uL (ref 0.0–0.5)
Eosinophils Relative: 1 %
HCT: 33.8 % — ABNORMAL LOW (ref 36.0–46.0)
Hemoglobin: 11.7 g/dL — ABNORMAL LOW (ref 12.0–15.0)
Immature Granulocytes: 1 %
Lymphocytes Relative: 21 %
Lymphs Abs: 2.2 10*3/uL (ref 0.7–4.0)
MCH: 27.9 pg (ref 26.0–34.0)
MCHC: 34.6 g/dL (ref 30.0–36.0)
MCV: 80.7 fL (ref 80.0–100.0)
Monocytes Absolute: 0.5 10*3/uL (ref 0.1–1.0)
Monocytes Relative: 5 %
Neutro Abs: 7.3 10*3/uL (ref 1.7–7.7)
Neutrophils Relative %: 72 %
Platelets: 291 10*3/uL (ref 150–400)
RBC: 4.19 MIL/uL (ref 3.87–5.11)
RDW: 13.2 % (ref 11.5–15.5)
WBC: 10.2 10*3/uL (ref 4.0–10.5)
nRBC: 0 % (ref 0.0–0.2)

## 2021-05-15 LAB — TYPE AND SCREEN
ABO/RH(D): A NEG
ABO/RH(D): A NEG
Antibody Screen: POSITIVE
Antibody Screen: POSITIVE

## 2021-05-15 LAB — RAPID HIV SCREEN (HIV 1/2 AB+AG)
HIV 1/2 Antibodies: NONREACTIVE
HIV-1 P24 Antigen - HIV24: NONREACTIVE

## 2021-05-15 LAB — RUPTURE OF MEMBRANE (ROM)PLUS: Rom Plus: POSITIVE

## 2021-05-15 SURGERY — Surgical Case
Anesthesia: Spinal

## 2021-05-15 MED ORDER — ACETAMINOPHEN 500 MG PO TABS
ORAL_TABLET | ORAL | Status: AC
  Administered 2021-05-15: 1000 mg via ORAL
  Filled 2021-05-15: qty 2

## 2021-05-15 MED ORDER — MORPHINE SULFATE (PF) 0.5 MG/ML IJ SOLN
INTRAMUSCULAR | Status: AC
  Filled 2021-05-15: qty 10

## 2021-05-15 MED ORDER — MORPHINE SULFATE (PF) 0.5 MG/ML IJ SOLN
INTRAMUSCULAR | Status: DC | PRN
  Administered 2021-05-15: .15 mg via EPIDURAL

## 2021-05-15 MED ORDER — COCONUT OIL OIL
1.0000 "application " | TOPICAL_OIL | Status: DC | PRN
  Administered 2021-05-15: 1 via TOPICAL
  Filled 2021-05-15 (×2): qty 120

## 2021-05-15 MED ORDER — GENTAMICIN SULFATE 40 MG/ML IJ SOLN
5.0000 mg/kg | INTRAVENOUS | Status: DC
  Administered 2021-05-15: 440 mg via INTRAVENOUS
  Filled 2021-05-15: qty 11

## 2021-05-15 MED ORDER — LACTATED RINGERS IV SOLN: INTRAVENOUS | Status: DC

## 2021-05-15 MED ORDER — PHENYLEPHRINE HCL (PRESSORS) 10 MG/ML IV SOLN
INTRAVENOUS | Status: DC | PRN
  Administered 2021-05-15: 80 ug via INTRAVENOUS
  Administered 2021-05-15: 160 ug via INTRAVENOUS
  Administered 2021-05-15: 80 ug via INTRAVENOUS
  Administered 2021-05-15: 160 ug via INTRAVENOUS
  Administered 2021-05-15 (×2): 80 ug via INTRAVENOUS

## 2021-05-15 MED ORDER — SODIUM CHLORIDE 0.9 % IV SOLN
500.0000 mg | INTRAVENOUS | Status: AC
  Administered 2021-05-15: 500 mg via INTRAVENOUS

## 2021-05-15 MED ORDER — FAMOTIDINE 20 MG PO TABS
10.0000 mg | ORAL_TABLET | Freq: Two times a day (BID) | ORAL | Status: DC
  Administered 2021-05-15 – 2021-05-17 (×5): 10 mg via ORAL
  Filled 2021-05-15 (×5): qty 1

## 2021-05-15 MED ORDER — KETOROLAC TROMETHAMINE 30 MG/ML IJ SOLN
30.0000 mg | Freq: Four times a day (QID) | INTRAMUSCULAR | Status: AC
  Administered 2021-05-15 – 2021-05-16 (×4): 30 mg via INTRAVENOUS
  Filled 2021-05-15 (×4): qty 1

## 2021-05-15 MED ORDER — BUPIVACAINE HCL (PF) 0.5 % IJ SOLN: 5.0000 mL | Freq: Once | INTRAMUSCULAR | Status: DC

## 2021-05-15 MED ORDER — FERROUS SULFATE 325 (65 FE) MG PO TABS
325.0000 mg | ORAL_TABLET | Freq: Two times a day (BID) | ORAL | Status: DC
  Administered 2021-05-16 – 2021-05-17 (×3): 325 mg via ORAL
  Filled 2021-05-15 (×3): qty 1

## 2021-05-15 MED ORDER — KETOROLAC TROMETHAMINE 30 MG/ML IJ SOLN: 30.0000 mg | Freq: Four times a day (QID) | INTRAMUSCULAR | Status: AC

## 2021-05-15 MED ORDER — BUPIVACAINE 0.25 % ON-Q PUMP DUAL CATH 400 ML
400.0000 mL | INJECTION | Status: DC
  Filled 2021-05-15: qty 400

## 2021-05-15 MED ORDER — WITCH HAZEL-GLYCERIN EX PADS: 1.0000 "application " | MEDICATED_PAD | CUTANEOUS | Status: DC | PRN

## 2021-05-15 MED ORDER — IBUPROFEN 600 MG PO TABS: 600.0000 mg | ORAL_TABLET | Freq: Four times a day (QID) | ORAL | Status: DC

## 2021-05-15 MED ORDER — LIDOCAINE HCL (PF) 1 % IJ SOLN
INTRAMUSCULAR | Status: DC | PRN
  Administered 2021-05-15: 3 mL via SUBCUTANEOUS

## 2021-05-15 MED ORDER — MEPERIDINE HCL 25 MG/ML IJ SOLN
INTRAMUSCULAR | Status: DC | PRN
  Administered 2021-05-15: 25 mg via INTRAVENOUS

## 2021-05-15 MED ORDER — OXYTOCIN-SODIUM CHLORIDE 30-0.9 UT/500ML-% IV SOLN
INTRAVENOUS | Status: DC | PRN
  Administered 2021-05-15: 30 [IU] via INTRAVENOUS

## 2021-05-15 MED ORDER — OXYTOCIN-SODIUM CHLORIDE 30-0.9 UT/500ML-% IV SOLN
INTRAVENOUS | Status: AC
  Filled 2021-05-15: qty 500

## 2021-05-15 MED ORDER — CLINDAMYCIN PHOSPHATE 900 MG/50ML IV SOLN
900.0000 mg | INTRAVENOUS | Status: AC
  Administered 2021-05-15: 900 mg via INTRAVENOUS

## 2021-05-15 MED ORDER — ACETAMINOPHEN 500 MG PO TABS
1000.0000 mg | ORAL_TABLET | Freq: Four times a day (QID) | ORAL | Status: AC
  Administered 2021-05-15 – 2021-05-16 (×3): 1000 mg via ORAL
  Filled 2021-05-15 (×3): qty 2

## 2021-05-15 MED ORDER — OXYTOCIN-SODIUM CHLORIDE 30-0.9 UT/500ML-% IV SOLN
2.5000 [IU]/h | INTRAVENOUS | Status: AC
  Filled 2021-05-15: qty 500

## 2021-05-15 MED ORDER — SODIUM CHLORIDE 0.9 % IV SOLN
INTRAVENOUS | Status: DC | PRN
  Administered 2021-05-15: 60 ug/min via INTRAVENOUS

## 2021-05-15 MED ORDER — ONDANSETRON HCL 4 MG/2ML IJ SOLN
INTRAMUSCULAR | Status: DC | PRN
  Administered 2021-05-15: 4 mg via INTRAVENOUS

## 2021-05-15 MED ORDER — DIPHENHYDRAMINE HCL 25 MG PO CAPS
25.0000 mg | ORAL_CAPSULE | Freq: Four times a day (QID) | ORAL | Status: DC | PRN
  Administered 2021-05-15: 25 mg via ORAL
  Filled 2021-05-15: qty 1

## 2021-05-15 MED ORDER — SOD CITRATE-CITRIC ACID 500-334 MG/5ML PO SOLN: 30.0000 mL | ORAL | Status: DC

## 2021-05-15 MED ORDER — DIBUCAINE (PERIANAL) 1 % EX OINT: 1.0000 "application " | TOPICAL_OINTMENT | CUTANEOUS | Status: DC | PRN

## 2021-05-15 MED ORDER — KETOROLAC TROMETHAMINE 30 MG/ML IJ SOLN
INTRAMUSCULAR | Status: DC | PRN
  Administered 2021-05-15: 30 mg via INTRAVENOUS

## 2021-05-15 MED ORDER — DEXAMETHASONE SODIUM PHOSPHATE 10 MG/ML IJ SOLN
INTRAMUSCULAR | Status: DC | PRN
  Administered 2021-05-15: 10 mg via INTRAVENOUS

## 2021-05-15 MED ORDER — PHENYLEPHRINE 40 MCG/ML (10ML) SYRINGE FOR IV PUSH (FOR BLOOD PRESSURE SUPPORT): PREFILLED_SYRINGE | INTRAVENOUS | Status: DC | PRN

## 2021-05-15 MED ORDER — INFLUENZA VAC SPLIT QUAD 0.5 ML IM SUSY
0.5000 mL | PREFILLED_SYRINGE | INTRAMUSCULAR | Status: AC
  Administered 2021-05-16: 0.5 mL via INTRAMUSCULAR
  Filled 2021-05-15: qty 0.5

## 2021-05-15 MED ORDER — SENNOSIDES-DOCUSATE SODIUM 8.6-50 MG PO TABS
2.0000 | ORAL_TABLET | ORAL | Status: DC
  Administered 2021-05-16 – 2021-05-17 (×2): 2 via ORAL
  Filled 2021-05-15 (×2): qty 2

## 2021-05-15 MED ORDER — MENTHOL 3 MG MT LOZG
1.0000 | LOZENGE | OROMUCOSAL | Status: DC | PRN
  Filled 2021-05-15: qty 9

## 2021-05-15 MED ORDER — LACTATED RINGERS IV SOLN: INTRAVENOUS | Status: DC | PRN

## 2021-05-15 MED ORDER — SODIUM CHLORIDE 0.9 % IV SOLN
INTRAVENOUS | Status: AC
  Filled 2021-05-15: qty 500

## 2021-05-15 MED ORDER — SIMETHICONE 80 MG PO CHEW
80.0000 mg | CHEWABLE_TABLET | Freq: Three times a day (TID) | ORAL | Status: DC
  Administered 2021-05-15 – 2021-05-17 (×6): 80 mg via ORAL
  Filled 2021-05-15 (×6): qty 1

## 2021-05-15 MED ORDER — ONDANSETRON HCL 4 MG/2ML IJ SOLN
INTRAMUSCULAR | Status: AC
  Administered 2021-05-15: 4 mg
  Filled 2021-05-15: qty 2

## 2021-05-15 MED ORDER — BUPIVACAINE HCL (PF) 0.5 % IJ SOLN
INTRAMUSCULAR | Status: AC
  Filled 2021-05-15: qty 30

## 2021-05-15 MED ORDER — MORPHINE SULFATE (PF) 0.5 MG/ML IJ SOLN
INTRAMUSCULAR | Status: DC | PRN
  Administered 2021-05-15: .5 mg via EPIDURAL
  Administered 2021-05-15: .85 mg via EPIDURAL
  Administered 2021-05-15: 2 mg via EPIDURAL
  Administered 2021-05-15: 1.5 mg via EPIDURAL

## 2021-05-15 MED ORDER — MEPERIDINE HCL 25 MG/ML IJ SOLN
INTRAMUSCULAR | Status: AC
  Filled 2021-05-15: qty 1

## 2021-05-15 MED ORDER — OXYCODONE-ACETAMINOPHEN 5-325 MG PO TABS
1.0000 | ORAL_TABLET | ORAL | Status: DC | PRN
  Administered 2021-05-16: 1 via ORAL
  Filled 2021-05-15: qty 1

## 2021-05-15 MED ORDER — BUPIVACAINE IN DEXTROSE 0.75-8.25 % IT SOLN
INTRATHECAL | Status: DC | PRN
  Administered 2021-05-15: 1.5 mL via INTRATHECAL

## 2021-05-15 MED ORDER — OXYCODONE-ACETAMINOPHEN 5-325 MG PO TABS
2.0000 | ORAL_TABLET | ORAL | Status: DC | PRN
Start: 2021-05-16 — End: 2021-05-16

## 2021-05-15 MED ORDER — SOD CITRATE-CITRIC ACID 500-334 MG/5ML PO SOLN
ORAL | Status: AC
  Filled 2021-05-15: qty 15

## 2021-05-15 MED ORDER — CLINDAMYCIN PHOSPHATE 900 MG/50ML IV SOLN
INTRAVENOUS | Status: AC
  Filled 2021-05-15: qty 50

## 2021-05-15 MED ORDER — PRENATAL MULTIVITAMIN CH
1.0000 | ORAL_TABLET | Freq: Every day | ORAL | Status: DC
  Administered 2021-05-16: 1 via ORAL
  Filled 2021-05-15: qty 1

## 2021-05-15 SURGICAL SUPPLY — 31 items
CATH KIT ON-Q SILVERSOAK 5IN (CATHETERS) ×4 IMPLANT
DERMABOND ADVANCED (GAUZE/BANDAGES/DRESSINGS) ×1
DERMABOND ADVANCED .7 DNX12 (GAUZE/BANDAGES/DRESSINGS) ×1 IMPLANT
DRSG OPSITE POSTOP 4X10 (GAUZE/BANDAGES/DRESSINGS) ×2 IMPLANT
DRSG TELFA 3X8 NADH (GAUZE/BANDAGES/DRESSINGS) ×2 IMPLANT
ELECT CAUTERY BLADE 6.4 (BLADE) ×2 IMPLANT
ELECT REM PT RETURN 9FT ADLT (ELECTROSURGICAL) ×2
ELECTRODE REM PT RTRN 9FT ADLT (ELECTROSURGICAL) ×1 IMPLANT
GAUZE SPONGE 4X4 12PLY STRL (GAUZE/BANDAGES/DRESSINGS) ×2 IMPLANT
GLOVE SURG ENC MOIS LTX SZ7 (GLOVE) ×2 IMPLANT
GLOVE SURG UNDER LTX SZ7.5 (GLOVE) ×2 IMPLANT
GOWN STRL REUS W/ TWL LRG LVL3 (GOWN DISPOSABLE) ×3 IMPLANT
GOWN STRL REUS W/TWL LRG LVL3 (GOWN DISPOSABLE) ×3
MANIFOLD NEPTUNE II (INSTRUMENTS) ×2 IMPLANT
MAT PREVALON FULL STRYKER (MISCELLANEOUS) ×2 IMPLANT
NS IRRIG 1000ML POUR BTL (IV SOLUTION) ×2 IMPLANT
PACK C SECTION AR (MISCELLANEOUS) ×2 IMPLANT
PAD OB MATERNITY 4.3X12.25 (PERSONAL CARE ITEMS) ×4 IMPLANT
PAD PREP 24X41 OB/GYN DISP (PERSONAL CARE ITEMS) ×2 IMPLANT
PENCIL SMOKE EVACUATOR (MISCELLANEOUS) ×2 IMPLANT
SCRUB EXIDINE 4% CHG 4OZ (MISCELLANEOUS) ×2 IMPLANT
STRIP CLOSURE SKIN 1/2X4 (GAUZE/BANDAGES/DRESSINGS) ×2 IMPLANT
SUT MNCRL 4-0 (SUTURE) ×1
SUT MNCRL 4-0 27XMFL (SUTURE) ×1
SUT PDS AB 1 TP1 96 (SUTURE) ×2 IMPLANT
SUT PLAIN GUT 0 (SUTURE) IMPLANT
SUT VIC AB 0 CTX 36 (SUTURE) ×2
SUT VIC AB 0 CTX36XBRD ANBCTRL (SUTURE) ×2 IMPLANT
SUTURE MNCRL 4-0 27XMF (SUTURE) ×1 IMPLANT
SWABSTK COMLB BENZOIN TINCTURE (MISCELLANEOUS) ×2 IMPLANT
WATER STERILE IRR 500ML POUR (IV SOLUTION) ×2 IMPLANT

## 2021-05-15 NOTE — Anesthesia Preprocedure Evaluation (Addendum)
Anesthesia Evaluation  Patient identified by MRN, date of birth, ID band Patient awake    Reviewed: Allergy & Precautions, NPO status , Patient's Chart, lab work & pertinent test results  History of Anesthesia Complications (+) Family history of anesthesia reaction  Airway Mallampati: II  TM Distance: >3 FB Neck ROM: Full    Dental  (+) Teeth Intact   Pulmonary neg pulmonary ROS,           Cardiovascular negative cardio ROS       Neuro/Psych negative neurological ROS     GI/Hepatic Neg liver ROS,   Endo/Other  negative endocrine ROS  Renal/GU negative Renal ROS     Musculoskeletal   Abdominal   Peds  Hematology negative hematology ROS (+)   Anesthesia Other Findings   Reproductive/Obstetrics (+) Pregnancy                             Anesthesia Physical Anesthesia Plan  ASA: 2 and emergent  Anesthesia Plan: Spinal   Post-op Pain Management:    Induction:   PONV Risk Score and Plan:   Airway Management Planned: Nasal Cannula  Additional Equipment:   Intra-op Plan:   Post-operative Plan:   Informed Consent: I have reviewed the patients History and Physical, chart, labs and discussed the procedure including the risks, benefits and alternatives for the proposed anesthesia with the patient or authorized representative who has indicated his/her understanding and acceptance.       Plan Discussed with:   Anesthesia Plan Comments: (OSA score 0)       Anesthesia Quick Evaluation

## 2021-05-15 NOTE — Addendum Note (Signed)
Addendum  created 05/15/21 0017 by Deno Etienne, MD   Attestation recorded in [place], [place] accepted, [place] Attestations filed

## 2021-05-15 NOTE — Anesthesia Postprocedure Evaluation (Signed)
Anesthesia Post Note  Patient: Erika Hunt  Procedure(s) Performed: CESAREAN SECTION  Patient location during evaluation: L&D Anesthesia Type: Spinal Level of consciousness: awake, oriented, patient cooperative and awake and alert Pain management: pain level controlled Vital Signs Assessment: post-procedure vital signs reviewed and stable Respiratory status: spontaneous breathing Postop Assessment: no headache, no backache, spinal receding and no apparent nausea or vomiting Anesthetic complications: no   No notable events documented.   Last Vitals:  Vitals:   05/15/21 0307  Temp: 36.8 C    Last Pain:  Vitals:   05/15/21 0307  TempSrc: [name]

## 2021-05-15 NOTE — H&P (Signed)
OB History & Physical   History of Present Illness:  Chief Complaint: water broke  HPI:  Erika Hunt is a 20 y.o. G1P0000 female at [redacted]w[redacted]d dated by a 6 week ultrasound.  Her pregnancy has been complicated by Rh negative status, breech presentation of fetus, abdominal pain in pregnancy .    She reports contractions.   She reports leakage of fluid at about 1-130 this morning, clear without blood.   She denies vaginal bleeding.   She reports fetal movement.    Total weight gain for pregnancy: 7.258 kg   Obstetrical Problem List: FIRST Problems (from 12/24/20 to present)     Problem Noted Resolved   Preterm premature rupture of membranes in third trimester 05/15/2021 by Will Bonnet, MD No   Malpresentation before onset of labor, not applicable or unspecified fetus 05/14/2021 by Will Bonnet, MD No   Rh negative state in antepartum period 03/21/2021 by Will Bonnet, MD No   Encounter for supervision of normal first pregnancy in third trimester 12/25/2020 by Rod Can, CNM No   Overview Addendum 04/06/2021  4:10 PM by Homero Fellers, MD     Nursing Staff Provider  Office Location  [facility] Dating  6 wk [place]  Language  English Anatomy US  complete  Flu Vaccine   Genetic Screen  NIPS: none  TDaP vaccine   04/06/2021 Hgb A1C or  GTT Third trimester : 111  Covid Unvaccinated Covid, Jan 2022   LAB RESULTS   Rhogam  03/21/2021 Blood Type   A negative  Feeding Plan Breast Antibody Negative (08/02 1118)  Contraception condoms Rubella    Circumcision  RPR Non Reactive (08/02 1118)   Pediatrician  [facility] HBsAg   negative  Support Person Husband: matthew HIV Non Reactive (08/02 1118)  Prenatal Classes discussed Varicella     GBS  (For PCN allergy, check sensitivities)   BTL Consent     VBAC Consent  Pap  10/18/2020- normal per patient, need records    Hgb Electro      CF      SMA                   Maternal Medical History:   Past Medical History:   Diagnosis Date   Elevated liver enzymes    Family history of adverse reaction to anesthesia    PT'S MOM STATES IT TAKES MORE TO SEDATE HER FOR HER PAST SURGERIES    Past Surgical History:  Procedure Laterality Date   CHOLECYSTECTOMY N/A 06/09/2016   Procedure: LAPAROSCOPIC CHOLECYSTECTOMY WITH INTRAOPERATIVE CHOLANGIOGRAM;  Surgeon: Robert Bellow, MD;  Location: [facility] ORS;  Service: General;  Laterality: N/A;   TEAR DUCT PROBING  2003   WISDOM TOOTH EXTRACTION      Allergies  Allergen Reactions   Penicillins Hives    Has patient had a PCN reaction causing immediate rash, facial/tongue/throat swelling, SOB or lightheadedness with hypotension: Yes Has patient had a PCN reaction causing severe rash involving mucus membranes or skin necrosis: No Has patient had a PCN reaction that required hospitalization No Has patient had a PCN reaction occurring within the last 10 years: Yes If all of the above answers are "NO", then may proceed with Cephalosporin use. Has patient had a PCN reaction causing immediate rash, facial/tongue/throat swelling, SOB or lightheadedness with hypotension: Yes Has patient had a PCN reaction causing severe rash involving mucus membranes or skin necrosis: No Has patient had a PCN reaction that  required hospitalization No Has patient had a PCN reaction occurring within the last 10 years: Yes If all of the above answers are "NO", then may proceed with Cephalosporin use.     Prior to Admission medications   Medication Sig Start Date End Date Taking? Authorizing Provider  famotidine (PEPCID) 10 MG tablet Take 10 mg by mouth 2 (two) times daily.   Yes [provider]  ondansetron (ZOFRAN ODT) 4 MG disintegrating tablet Take 1 tablet (4 mg total) by mouth every 6 (six) hours as needed for nausea. 05/06/21  Yes Schuman, Stefanie Libel, MD  Prenatal Vit-Fe Fumarate-FA (PRENATAL PO) Take by mouth.   Yes [provider]  Doxylamine Succinate, Sleep,  (UNISOM PO) Take by mouth.    [provider]    OB History  Gravida Para Term Preterm AB Living  1 0 0 0 0 0  SAB IAB Ectopic Multiple Live Births  0 0 0 0      # Outcome Date GA Lbr Len/2nd Weight Sex Delivery Anes PTL Lv  1 Current             Obstetric Comments  1st Menstrual Cycle:  9    Prenatal care site: [facility]  Social History: She  reports that she has never smoked. She has never used smokeless tobacco. She reports that she does not drink alcohol and does not use drugs.  Family History: family history includes Arthritis in her maternal grandmother; Breast cancer (age of onset: 38) in her maternal aunt; Breast cancer (age of onset: 48) in her paternal aunt; Diabetes in her paternal grandfather; Diabetes Mellitus II in her maternal grandfather; Hyperlipidemia in her maternal grandfather.   Review of Systems  Constitutional: Negative.   HENT: Negative.    Eyes: Negative.   Respiratory: Negative.    Cardiovascular: Negative.   Gastrointestinal: Negative.   Genitourinary: Negative.   Musculoskeletal: Negative.   Skin: Negative.   Neurological: Negative.   Psychiatric/Behavioral: Negative.      Physical Exam:  Temp 98.2 F (36.8 C) (Oral)   LMP 08/28/2020 (Exact Date)   Physical Exam Constitutional:      General: She is not in acute distress.    Appearance: Normal appearance. She is well-developed.  HENT:     Head: Normocephalic and atraumatic.  Eyes:     General: No scleral icterus.    Conjunctiva/sclera: Conjunctivae normal.  Cardiovascular:     Rate and Rhythm: Normal rate and regular rhythm.     Heart sounds: No murmur heard.   No friction rub. No gallop.  Pulmonary:     Effort: Pulmonary effort is normal. No respiratory distress.     Breath sounds: Normal breath sounds. No wheezing or rales.  Abdominal:     General: Bowel sounds are normal. There is no distension.     Palpations: Abdomen is soft. There is mass (gravid, mildly ttp).      Tenderness: There is no abdominal tenderness. There is no guarding or rebound.  Musculoskeletal:        General: Normal range of motion.     Cervical back: Normal range of motion and neck supple.  Neurological:     General: No focal deficit present.     Mental Status: She is alert and oriented to person, place, and time.     Cranial Nerves: No cranial nerve deficit.  Skin:    General: Skin is warm and dry.     Findings: No erythema.  Psychiatric:  Mood and Affect: Mood normal.        Behavior: Behavior normal.        Judgment: Judgment normal.     Baseline FHR: 135 beats/min   Variability: moderate   Accelerations: present   Decelerations: absent Contractions: present, though not tracing well Overall assessment: acat 1  Bedside Ultrasound:  Number of Fetus: 1  Presentation: complete breech  Placental Location: posterior  Lab Results  Component Value Date   SARSCOV2NAA NEGATIVE 05/15/2021   ROM Plus: Positive   Assessment:  Erika Hunt is a 20 y.o. G108P0000 female at 103w0d with Preterm Prelabor Rupture of Membranes.   Plan:  Admit to Labor & Delivery  CBC, T&S, NPO, IVF GBS unknown.   Fetwal well-being: cat 1 To OR for primary cesarean section due to malpresentation of fetus and ruptured fetal membranes. I reviewed the risks and benefits of cesarean section, including but not limited to; bleeding, infection, damage to nearby organs (like bowel, bladder, nerves, blood vessels), and risks of anesthesia.  She voiced understanding and agreement to proceed.  Will make [facility] aware.    Prentice Docker, MD 05/15/2021 4:07 AM

## 2021-05-15 NOTE — Addendum Note (Signed)
Addendum  created 05/15/21 0856 by Arita Miss, MD   Order list changed, Order sets accessed, Pharmacy for encounter modified

## 2021-05-15 NOTE — Discharge Summary (Signed)
Postpartum Discharge Summary    Patient Name: Erika Hunt DOB: [birth year] MRN: [id number]  Date of admission: 05/15/2021 Delivery date:05/15/2021  Delivering provider: Prentice Docker D  Date of discharge: 05/17/2021  Admitting diagnosis: Malpresentation of fetus, antepartum [O32.9XX0], Preterm premature rupture of membranes,  Intrauterine pregnancy: [redacted]w[redacted]d    Secondary diagnosis:  Principal Problem:   Encounter for supervision of normal first pregnancy in third trimester Active Problems:   Rh negative state in antepartum period   Malpresentation before onset of labor, not applicable or unspecified fetus   [redacted] weeks gestation of pregnancy   Preterm premature rupture of membranes in third trimester   Malpresentation of fetus, antepartum  Additional problems: non    Discharge diagnosis: Preterm Pregnancy Delivered                                              Post partum procedures: none Augmentation: N/A Complications: None  Hospital course: The patient presented to triage with gross rupture of membranes. Given her gestational age, this was confirmed with ROM Plus.  She had previously been diagnosed as having a breech fetus.  This was confirmed again just prior to going to the OR.  She was taken to the OR for primary cesarean section due to malpresentation and ruptured membranes, which occurred without incident. See the operative report for details. Her postpartum course was significant for no concerns.  Magnesium Sulfate received: No BMZ received: No Rhophylac:Yes MMR:No T-DaP:Given prenatally on 04/06/2021 Transfusion:No  Physical exam  Vitals:   05/16/21 1123 05/16/21 2336 05/17/21 0406 05/17/21 0750  BP: 109/70 115/73 112/76 111/81  Pulse: 79 74 79 80  Resp: 18 16 18 18   Temp: 98.2 F (36.8 C) 97.6 F (36.4 C) 98.1 F (36.7 C) 97.9 F (36.6 C)  TempSrc: Oral Oral Oral Oral  SpO2:  98% 99% 99%   General: alert, cooperative, and no distress Lochia:  appropriate Uterine Fundus: firm Incision: Healing well with no significant drainage, No significant erythema, Dressing is clean, dry, and intact DVT Evaluation: No evidence of DVT seen on physical exam. Negative Homan's sign. No cords or calf tenderness. Labs: Lab Results  Component Value Date   WBC 13.7 (H) 05/16/2021   HGB 10.0 (L) 05/16/2021   HCT 29.0 (L) 05/16/2021   MCV 81.0 05/16/2021   PLT 200 05/16/2021   CMP Latest Ref Rng & Units 05/14/2021  Glucose 70 - 99 mg/dL 92  BUN 6 - 20 mg/dL 5(L)  Creatinine 0.44 - 1.00 mg/dL 0.64  Sodium 135 - 145 mmol/L 137  Potassium 3.5 - 5.1 mmol/L 3.6  Chloride 98 - 111 mmol/L 104  CO2 22 - 32 mmol/L 22  Calcium 8.9 - 10.3 mg/dL 8.8(L)  Total Protein 6.5 - 8.1 g/dL 6.2(L)  Total Bilirubin 0.3 - 1.2 mg/dL 1.0  Alkaline Phos 38 - 126 U/L 165(H)  AST 15 - 41 U/L 18  ALT 0 - 44 U/L 14   Edinburgh Score: Edinburgh Postnatal Depression Scale Screening Tool 05/15/2021  I have been able to laugh and see the funny side of things. 0  I have looked forward with enjoyment to things. 0  I have blamed myself unnecessarily when things went wrong. 1  I have been anxious or worried for no good reason. 2  I have felt scared or panicky for no good reason. 2  Things have been getting on top of me. 1  I have been so unhappy that I have had difficulty sleeping. 0  I have felt sad or miserable. 0  I have been so unhappy that I have been crying. 0  The thought of harming myself has occurred to me. 0  Edinburgh Postnatal Depression Scale Total 6      After visit meds:  Allergies as of 05/17/2021       Reactions   Penicillins Hives   Has patient had a PCN reaction causing immediate rash, facial/tongue/throat swelling, SOB or lightheadedness with hypotension: Yes Has patient had a PCN reaction causing severe rash involving mucus membranes or skin necrosis: No Has patient had a PCN reaction that required hospitalization No Has patient had a PCN  reaction occurring within the last 10 years: Yes If all of the above answers are "NO", then may proceed with Cephalosporin use. Has patient had a PCN reaction causing immediate rash, facial/tongue/throat swelling, SOB or lightheadedness with hypotension: Yes Has patient had a PCN reaction causing severe rash involving mucus membranes or skin necrosis: No Has patient had a PCN reaction that required hospitalization No Has patient had a PCN reaction occurring within the last 10 years: Yes If all of the above answers are "NO", then may proceed with Cephalosporin use.        Medication List     STOP taking these medications    ondansetron 4 MG disintegrating tablet Commonly known as: Zofran ODT   UNISOM PO       TAKE these medications    famotidine 10 MG tablet Commonly known as: PEPCID Take 10 mg by mouth 2 (two) times daily.   oxyCODONE-acetaminophen 5-325 MG tablet Commonly known as: Percocet Take 1 tablet by mouth every 4 (four) hours as needed for severe pain.   PRENATAL PO Take by mouth.               Discharge Care Instructions  (From admission, onward)           Start     Ordered   05/17/21 0000  If the dressing is still on your incision site when you go home, remove it on the third day after your surgery date. Remove dressing if it begins to fall off, or if it is dirty or damaged before the third day.        05/17/21 0949             Discharge home in stable condition Infant Feeding: Breast Infant Disposition:home with mother Discharge instruction: per After Visit Summary and Postpartum booklet. Activity: Advance as tolerated. Pelvic rest for 6 weeks.  Diet: routine diet Anticipated Birth Control: POPs Postpartum Appointment:1 week Additional Postpartum F/U: Incision check 1 week Future Appointments: Future Appointments  Date Time Provider [place]  05/17/2021  4:10 PM Malachy Mood, MD [facility] None  06/01/2021  4:30 PM  Rod Can, CNM [facility] None  06/08/2021  4:10 PM Gilman Schmidt, Stefanie Libel, MD [facility] None  06/15/2021  4:10 PM Will Bonnet, MD [facility] None   Follow up Visit:  Follow-up Information     Will Bonnet, MD. Schedule an appointment as soon as possible for a visit in 1 week(s).   Specialty: Obstetrics and Gynecology Why: For incision check Contact information: [address] [place] [address] [phone]                SIGNED: Barnett Applebaum, MD, [facility], [facility] 05/17/2021  9:51 AM

## 2021-05-15 NOTE — Transfer of Care (Signed)
Immediate Anesthesia Transfer of Care Note  Patient: Erika Hunt  Procedure(s) Performed: CESAREAN SECTION  Patient Location: PACU and Mother/Baby  Anesthesia Type:Spinal  Level of Consciousness: awake, alert , oriented and patient cooperative  Airway & Oxygen Therapy: Patient Spontanous Breathing  Post-op Assessment: Report given to RN  Post vital signs: Reviewed and stable    Last Vitals:  Vitals Value Taken Time  BP    Temp    Pulse 97   Resp 18   SpO2 98     Last Pain:  Vitals:   05/15/21 0307  TempSrc: Oral         Complications: No notable events documented.

## 2021-05-15 NOTE — Op Note (Addendum)
Cesarean Section Operative Note    Patient Name: Erika Hunt  MRN: [id number]  Date of Surgery: 05/15/2021   Pre-operative Diagnosis:  1) Malpresentation of fetus (breech) 2) Preterm prelabor rupture of membranes 3) intrauterine pregnancy at [redacted]w[redacted]d   Post-operative Diagnosis:  1) Malpresentation of fetus (breech) 2) Preterm prelabor rupture of membranes 3) intrauterine pregnancy at [redacted]w[redacted]d    Procedure: Primary Low Transverse Cesarean Section via Pfannenstiel incision with double layer uterine closure  Surgeon: Surgeon(s) and Role:    Will Bonnet, MD - Primary   Anesthesia: spinal   Findings:  1) normal appearing fallopian tubes, and ovaries 2) 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 Thin wall at the cornual region.  I DO NOT RECOMMEND LABOR IN THE FUTURE DUE TO THE THIN WALL OF HER UTERINE FUNDUS. 3) Viable female infant with weight of 2,390 grams (5 lb 4 oz), APGARs 8 and 9   Quantified Blood Loss: 680 mL  Total IV Fluids: 700 ml   Urine Output:  100 mL clear urine at end of procedure  Specimens: none  Complications: no complications  Disposition: PACU - hemodynamically stable.   Maternal Condition: stable   Baby condition / location:  Couplet care / Skin to Skin  Procedure Details:  The patient was seen in the Holding Room. The risks, benefits, complications, treatment options, and expected outcomes were discussed with the patient. The patient concurred with the proposed plan, giving informed consent. identified as Erika Hunt and the procedure verified as C-Section Delivery. A Time Out was held and the above information confirmed.   After induction of anesthesia, the patient was draped and prepped in the usual sterile manner. A Pfannenstiel incision was made and carried down through the subcutaneous tissue to the fascia. Fascial incision was made and extended transversely. The fascia was separated from the underlying  rectus tissue superiorly and inferiorly. The peritoneum was identified and entered. Peritoneal incision was extended longitudinally. The bladder flap was not bluntly or sharply freed from the lower uterine segment. A low transverse uterine incision was made and the hysterotomy was extended with cranial-caudal tension. Delivered from breech (footling) presentation was a 2,390 gram Living newborn infant(s) or Female with Apgar scores of 8 at one minute and 9 at five minutes. Cord ph was not sent the umbilical cord was clamped and cut cord blood was obtained for evaluation. The placenta was removed Intact and appeared normal. The uterine outline was consistent with an arcuate uterus with the placenta in the right cornu. The bilateral fallopian tubes and ovaries appeared normal. The uterine incision was closed with running locked sutures of 0 Vicryl.  A second layer of the same suture was thrown in an imbricating fashion.  Hemostasis was assured.  The uterus was returned to the abdomen and the paracolic gutters were cleared of all clots and debris.  The rectus muscles were inspected and found to be hemostatic.  The On-Q catheter pumps were inserted in accordance with the manufacturer's recommendations.  The catheters were inserted approximately 4cm cephelad to the incision line, approximately 1cm apart, straddling the midline.  They were inserted to a depth of the 4th mark. They were positioned superficial to the rectus abdominus muscles and deep to the rectus fascia.    The fascia was then reapproximated with running sutures of 1-0 PDS, looped. A subcutaneous, running layer of 3-0 vicryl was used to reduce tension on the skin closure.  The subcuticular closure  was performed using 4-0 monocryl. The skin closure was reinforced using surgical skin glue.  The On-Q catheters were bolused with 5 mL of 0.5% marcaine plain for a total of 10 mL.  The catheters were affixed to the skin with surgical skin glue,  steri-strips, and tegaderm.    Instrument, sponge, and needle counts were correct prior the abdominal closure and were correct at the conclusion of the case.  The patient received gentamicin (440 mg), clindamycin (900 mg), and azithromycin (500 mg) IV prior to skin incision (within 30 minutes). For VTE prophylaxis she was wearing SCDs throughout the case.    Signed: Will Bonnet, MD 05/15/2021 6:07 AM

## 2021-05-15 NOTE — Anesthesia Procedure Notes (Signed)
Spinal  Patient location during procedure: OB Start time: 05/15/2021 4:40 AM Reason for block: surgical anesthesia Staffing Performed: anesthesiologist  Anesthesiologist: Deno Etienne, MD Preanesthetic Checklist Completed: patient identified, IV checked, site marked, risks and benefits discussed, surgical consent, monitors and equipment checked, pre-op evaluation and timeout performed Spinal Block Patient position: sitting Prep: DuraPrep Patient monitoring: heart rate, cardiac monitor, continuous pulse ox and blood pressure Approach: midline Location: L3-4 Injection technique: single-shot Needle Needle type: Sprotte  Needle gauge: 24 G Needle length: 9 cm Needle insertion depth: 6 cm Assessment Sensory level: T4 Events: CSF return Additional Notes Bupivacaine 0.75% 1.5 ml and Duramorph 0.15 mg

## 2021-05-16 ENCOUNTER — Encounter: Payer: Self-pay | Admitting: Obstetrics and Gynecology

## 2021-05-16 LAB — CBC
HCT: 29 % — ABNORMAL LOW (ref 36.0–46.0)
Hemoglobin: 10 g/dL — ABNORMAL LOW (ref 12.0–15.0)
MCH: 27.9 pg (ref 26.0–34.0)
MCHC: 34.5 g/dL (ref 30.0–36.0)
MCV: 81 fL (ref 80.0–100.0)
Platelets: 200 10*3/uL (ref 150–400)
RBC: 3.58 MIL/uL — ABNORMAL LOW (ref 3.87–5.11)
RDW: 13 % (ref 11.5–15.5)
WBC: 13.7 10*3/uL — ABNORMAL HIGH (ref 4.0–10.5)
nRBC: 0 % (ref 0.0–0.2)

## 2021-05-16 LAB — FETAL SCREEN: Fetal Screen: NEGATIVE

## 2021-05-16 LAB — RPR: RPR Ser Ql: NONREACTIVE

## 2021-05-16 MED ORDER — IBUPROFEN 600 MG PO TABS: 600.0000 mg | ORAL_TABLET | Freq: Four times a day (QID) | ORAL | Status: DC

## 2021-05-16 MED ORDER — ACETAMINOPHEN 325 MG PO TABS: 650.0000 mg | ORAL_TABLET | ORAL | Status: DC | PRN

## 2021-05-16 MED ORDER — IBUPROFEN 600 MG PO TABS
600.0000 mg | ORAL_TABLET | Freq: Four times a day (QID) | ORAL | Status: DC | PRN
  Administered 2021-05-16 – 2021-05-17 (×2): 600 mg via ORAL
  Filled 2021-05-16 (×2): qty 1

## 2021-05-16 MED ORDER — OXYCODONE HCL 5 MG PO TABS
5.0000 mg | ORAL_TABLET | ORAL | Status: DC | PRN
  Administered 2021-05-16 – 2021-05-17 (×2): 5 mg via ORAL
  Filled 2021-05-16 (×2): qty 1

## 2021-05-16 MED ORDER — ONDANSETRON 4 MG PO TBDP
4.0000 mg | ORAL_TABLET | Freq: Four times a day (QID) | ORAL | Status: DC | PRN
  Administered 2021-05-16 – 2021-05-17 (×2): 4 mg via ORAL
  Filled 2021-05-16 (×2): qty 1

## 2021-05-16 MED ORDER — ACETAMINOPHEN 500 MG PO TABS
1000.0000 mg | ORAL_TABLET | Freq: Four times a day (QID) | ORAL | Status: DC
  Administered 2021-05-16 – 2021-05-17 (×2): 1000 mg via ORAL
  Filled 2021-05-16 (×2): qty 2

## 2021-05-16 MED ORDER — RHO D IMMUNE GLOBULIN 1500 UNIT/2ML IJ SOSY
300.0000 ug | PREFILLED_SYRINGE | Freq: Once | INTRAMUSCULAR | Status: AC
  Administered 2021-05-16: 300 ug via INTRAVENOUS
  Filled 2021-05-16: qty 2

## 2021-05-16 NOTE — Lactation Note (Signed)
This note was copied from a baby's chart. Lactation Consultation Note  Patient Name: Erika Hunt [facility] Date: 05/16/2021 Reason for consult: Follow-up assessment Age:22 hours  Lactation called to beside to assist with feed. Parents had attempted on L breast, but had baby positioned in cradle/cross-cradle for R breast. LC assisted with sandwiching of the breast tissue and baby grasped the breast easily and began a strong rhythmic suck pattern. Parents reminded of tips to keep baby alert and awake at the breast. Baby fed for total of 15 minutes.  12pm- LC returned for set-up of the DEBP at bedside per Late-Preterm feeding protocol.  Mom educated on on set-up of pump, use, cleaning, frequency, and milk storage. Mom instructed in pumping every 3 hours.  First pump session yielded 81mL- label placed and put in fridge.  Maternal Data Has patient been taught Hand Expression?: Yes Does the patient have breastfeeding experience prior to this delivery?: No  Feeding Mother's Current Feeding Choice: Breast Milk  LATCH Score Latch: Grasps breast easily, tongue down, lips flanged, rhythmical sucking.  Audible Swallowing: A few with stimulation  Type of Nipple: Everted at rest and after stimulation  Comfort (Breast/Nipple): Soft / non-tender  Hold (Positioning): Assistance needed to correctly position infant at breast and maintain latch.  LATCH Score: 8   Lactation Tools Discussed/Used Tools: Pump Breast pump type: Double-Electric Breast Pump Pump Education: Setup, frequency, and cleaning;Milk Storage Reason for Pumping: 36wk; protocol Pumping frequency: q 3hrs  Interventions Interventions: DEBP  Discharge Pump: Personal  Consult Status Consult Status: Follow-up Date: 05/16/21 Follow-up type: Call as needed    Lavonia Drafts 05/16/2021, 12:27 PM

## 2021-05-16 NOTE — Progress Notes (Addendum)
Obstetric Postpartum/PostOperative Daily Progress Note Subjective:  20 y.o. G1P0101 post-operative day # 1 status post primary cesarean delivery.  She is ambulating, is tolerating po, is voiding spontaneously.  Her pain is well controlled on PO pain medications and On Q pump. Her lochia is less than menses. She reports breastfeeding is going well.   Medications SCHEDULED MEDICATIONS   famotidine  10 mg Oral BID   ferrous sulfate  325 mg Oral BID WC   influenza vac split quadrivalent PF  0.5 mL Intramuscular Tomorrow-1000   prenatal multivitamin  1 tablet Oral Q1200   senna-docusate  2 tablet Oral Q24H   simethicone  80 mg Oral TID PC    MEDICATION INFUSIONS   lactated ringers Stopped (05/15/21 0812)    PRN MEDICATIONS  coconut oil, witch hazel-glycerin **AND** dibucaine, diphenhydrAMINE, menthol-cetylpyridinium, ondansetron, oxyCODONE-acetaminophen **OR** oxyCODONE-acetaminophen    Objective:   Vitals:   05/16/21 0130 05/16/21 0257 05/16/21 0408 05/16/21 0739  BP:    113/74  Pulse:    75  Resp:    20  Temp:    98.3 F (36.8 C)  TempSrc:    Oral  SpO2: 98% 97% 98% 100%    Current Vital Signs 24h Vital Sign Ranges  T 98.3 F (36.8 C) Temp  Avg: 98.1 F (36.7 C)  Min: 97.1 F (36.2 C)  Max: 98.4 F (36.9 C)  BP 113/74 BP  Min: 96/65  Max: 115/72  HR 75 Pulse  Avg: 81.1  Min: 75  Max: 85  RR 20 Resp  Avg: 18.3  Min: 16  Max: 20  SaO2 100 % Room Air SpO2  Avg: 98.2 %  Min: 97 %  Max: 100 %       24 Hour I/O Current Shift I/O  Time Ins Outs 09/25 0701 - 09/26 0700 In: 950 [P.O.:240; I.V.:710] Out: 2285 [Urine:2175] No intake/output data recorded.   General: NAD Pulmonary: no increased work of breathing Abdomen: non-distended, non-tender, fundus firm at level of umbilicus Inc: Clean/dry/intact, sero sang drainage On Q dressing- does not appear active Extremities: no edema, no erythema, no tenderness  Labs:  Recent Labs  Lab 05/14/21 0913 05/15/21 0251  05/16/21 0636  WBC 12.1* 10.2 13.7*  HGB 11.5* 11.7* 10.0*  HCT 33.9* 33.8* 29.0*  PLT 268 291 200     Assessment:   19 y.o. G1P0101 postoperative day # 1 status post primary cesarean section, lactating  Plan:  1) Acute blood loss anemia - hemodynamically stable and asymptomatic - po ferrous sulfate  2) A NEG / Rubella Immune (03/04 0000)/ Varicella Unknown  3) TDAP status given antepartum  4) Breastfeeding  5) Contraception = condoms  6) Disposition: continue current care   Rod Can, CNM 05/16/2021 9:47 AM

## 2021-05-16 NOTE — Lactation Note (Signed)
This note was copied from a baby's chart. Lactation Consultation Note  Patient Name: Erika Hunt [id number] Date: 05/16/2021 Reason for consult: Initial assessment;Primapara;Late-preterm 34-36.6wks;Infant < 6lbs;Other (Comment) (c-section) Age:20 years Initial lactation visit. Mom is P1, c-section 29hrs ago due to PROM with breech presentation. Baby has done well at the breast per parents and RN staff. Mom notes some discomfort with a few of the feedings.  Last feeding at 0810, parents have been instructed on the importance of frequent feedings of every 3hrs due to infants weeks gestation and weight.   LC reviewed breastfeeding basics: stomach size, feeding patterns, early cues, skin to skin, output expectations. LC also provided education on [facility] late pre-term protocol: due to infants weeks gestation/size the recommendation to begin pumping post feedings (every 3 hours) to ensure adequate emptying, and potential need for supplementation. Parents verbalize understanding and open to pumping. Mom desires to prepare for back to work.   Parents plan to call out at next feeding for observation/assistance and pump set-up and education.  Maternal Data Has patient been taught Hand Expression?: Yes Does the patient have breastfeeding experience prior to this delivery?: No  Feeding Mother's Current Feeding Choice: Breast Milk  LATCH Score                    Lactation Tools Discussed/Used    Interventions Interventions: Breast feeding basics reviewed;Hand express;DEBP;Education (BF w/ 36wk infant)  Discharge Pump: Personal (being mailed by insurance company)  Consult Status Consult Status: Follow-up Date: 05/16/21 Follow-up type: In-patient    Lavonia Drafts 05/16/2021, 10:36 AM

## 2021-05-17 ENCOUNTER — Other Ambulatory Visit: Payer: Self-pay | Admitting: Obstetrics & Gynecology

## 2021-05-17 ENCOUNTER — Encounter: Payer: 59 | Admitting: Obstetrics and Gynecology

## 2021-05-17 LAB — RHOGAM INJECTION: Unit division: 0

## 2021-05-17 MED ORDER — OXYCODONE-ACETAMINOPHEN 5-325 MG PO TABS: 1.0000 | ORAL_TABLET | ORAL | 0 refills | Status: DC | PRN

## 2021-05-17 MED ORDER — NORETHINDRONE 0.35 MG PO TABS: 1.0000 | ORAL_TABLET | Freq: Every day | ORAL | 11 refills | Status: DC

## 2021-05-17 NOTE — Progress Notes (Signed)
Patient d/c home with infant. D/c instructions, Rx, and f/u appt given to and reviewed with pt. Pt verbalized understanding. Escorted out by auxillary.

## 2021-05-17 NOTE — Lactation Note (Signed)
This note was copied from a baby's chart. Lactation Consultation Note  Patient Name: Erika Hunt Date: 05/17/2021 Reason for consult: Follow-up assessment;Primapara;Late-preterm 34-36.6wks;Infant < 6lbs Age:22 hours  Lactation visit prior to anticipated discharge. Baby has fed well, parents diligent about waking frequent, voided/stooled, passed carseat test and 24hr screens.   Mom has been independent with her feedings overnight, with support of her husband and mom. Mom feels confident in her breastfeeding journey at this time, and understands continued need to pump/build supply and have available for supplement as needed per MD recommendation due to infants size and weeks gestation.   Guidance given for cluster feeding, growth spurts, output expectations, breast changes and management, and nipple care. [place] loaned for 1 week until insurance pump arrives, educated on set-up and use, and return directions.  Outpatient lactation information provided and community support. Encouraged to reach out with questions/concerns and for ongoing support as needed.  Maternal Data Has patient been taught Hand Expression?: Yes Does the patient have breastfeeding experience prior to this delivery?: No  Feeding Mother's Current Feeding Choice: Breast Milk  LATCH Score                    Lactation Tools Discussed/Used Tools: Pump;Comfort gels;Coconut oil Breast pump type: Double-Electric Breast Pump Reason for Pumping: 36wk  Interventions Interventions: Breast feeding basics reviewed;Assisted with latch;Education;Pre-pump if needed;Coconut oil;Comfort gels;DEBP  Discharge Discharge Education: Engorgement and breast care;Warning signs for feeding baby;Outpatient recommendation Pump: Rented;Advised to call insurance company  Consult Status Consult Status: Complete Date: 05/17/21 Follow-up type: Call as needed    Erika Hunt 05/17/2021, 10:52 AM

## 2021-05-23 ENCOUNTER — Other Ambulatory Visit: Payer: Self-pay

## 2021-05-23 ENCOUNTER — Ambulatory Visit (INDEPENDENT_AMBULATORY_CARE_PROVIDER_SITE_OTHER): Payer: BC Managed Care – PPO | Admitting: Obstetrics and Gynecology

## 2021-05-23 ENCOUNTER — Encounter: Payer: Self-pay | Admitting: Obstetrics and Gynecology

## 2021-05-23 VITALS — BP 100/70 | Ht 68.0 in | Wt 183.0 lb

## 2021-05-23 DIAGNOSIS — Z09 Encounter for follow-up examination after completed treatment for conditions other than malignant neoplasm: Secondary | ICD-10-CM

## 2021-05-23 NOTE — Progress Notes (Signed)
   Postoperative Follow-up Patient presents post op from cesarean section  8  days  ago.  Subjective: She denies fever, chills, nausea, and vomiting. Eating a regular diet without difficulty. Pain: she is only requiring ibuprofen.  Activity: increasing slowly. She denies issues with her incision.    Objective: BP 100/70   Ht 5\' 8"  (1.727 m)   Wt 183 lb (83 kg)   BMI 27.83 kg/m   Physical Exam Constitutional:      General: She is not in acute distress.    Appearance: Normal appearance.  HENT:     Head: Normocephalic and atraumatic.  Eyes:     General: No scleral icterus.    Conjunctiva/sclera: Conjunctivae normal.  Abdominal:     General: There is no distension.     Palpations: Abdomen is soft. There is mass (uterine fundus at U-4).     Tenderness: There is no abdominal tenderness. There is no guarding or rebound.     Comments: Incision: without erythema, induration, warmth, and tenderness. It is clean, dry, and intact.     Neurological:     General: No focal deficit present.     Mental Status: She is alert and oriented to person, place, and time.     Cranial Nerves: No cranial nerve deficit.  Psychiatric:        Mood and Affect: Mood normal.        Behavior: Behavior normal.        Judgment: Judgment normal.    Assessment: 20 y.o. s/p cesarean section progressing well  Plan: Patient has done well after surgery with no apparent complications.  I have discussed the post-operative course to date, and the expected progress moving forward.  The patient understands what complications to be concerned about.    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  Prentice Docker, MD 05/23/2021 10:20 AM

## 2021-05-31 ENCOUNTER — Telehealth: Payer: Self-pay

## 2021-05-31 NOTE — Telephone Encounter (Signed)
FMLA/DISABILITY form for [employer] filled out.  Will get signature and process.

## 2021-06-01 ENCOUNTER — Encounter: Payer: 59 | Admitting: Advanced Practice Midwife

## 2021-06-08 ENCOUNTER — Encounter: Payer: 59 | Admitting: Obstetrics and Gynecology

## 2021-06-12 ENCOUNTER — Inpatient Hospital Stay: Admit: 2021-06-12 | Payer: Self-pay

## 2021-06-14 ENCOUNTER — Telehealth: Payer: Self-pay

## 2021-06-14 NOTE — Telephone Encounter (Signed)
Patient is scheduled for mirena placment at her 6 weeks PP visit with Dr. Glennon Mac on 06/27/21 at 4:10

## 2021-06-15 ENCOUNTER — Encounter: Payer: 59 | Admitting: Obstetrics and Gynecology

## 2021-06-20 NOTE — Telephone Encounter (Signed)
Noted. Will order to arrive by apt date/time. 

## 2021-06-27 ENCOUNTER — Other Ambulatory Visit: Payer: Self-pay

## 2021-06-27 ENCOUNTER — Ambulatory Visit: Payer: BC Managed Care – PPO | Admitting: Obstetrics and Gynecology

## 2021-06-27 NOTE — Telephone Encounter (Signed)
Patient is scheduled for 06/30/21 at 11:10 with [facility] in [facility]

## 2021-06-28 NOTE — Telephone Encounter (Signed)
Noted  

## 2021-06-30 ENCOUNTER — Encounter: Payer: Self-pay | Admitting: Obstetrics and Gynecology

## 2021-06-30 ENCOUNTER — Other Ambulatory Visit (HOSPITAL_COMMUNITY)
Admission: RE | Admit: 2021-06-30 | Discharge: 2021-06-30 | Disposition: A | Discharge status: Home / Self Care | Payer: BC Managed Care – PPO | Source: Ambulatory Visit | Attending: Obstetrics and Gynecology | Admitting: Obstetrics and Gynecology

## 2021-06-30 ENCOUNTER — Other Ambulatory Visit: Payer: Self-pay

## 2021-06-30 ENCOUNTER — Ambulatory Visit (INDEPENDENT_AMBULATORY_CARE_PROVIDER_SITE_OTHER): Payer: BC Managed Care – PPO | Admitting: Obstetrics and Gynecology

## 2021-06-30 DIAGNOSIS — Z30017 Encounter for initial prescription of implantable subdermal contraceptive: Secondary | ICD-10-CM | POA: Diagnosis not present

## 2021-06-30 DIAGNOSIS — Z113 Encounter for screening for infections with a predominantly sexual mode of transmission: Secondary | ICD-10-CM

## 2021-06-30 DIAGNOSIS — R87610 Atypical squamous cells of undetermined significance on cytologic smear of cervix (ASC-US): Secondary | ICD-10-CM | POA: Diagnosis not present

## 2021-06-30 DIAGNOSIS — Z124 Encounter for screening for malignant neoplasm of cervix: Secondary | ICD-10-CM

## 2021-06-30 NOTE — Progress Notes (Signed)
Postpartum Visit   Chief Complaint  Patient presents with   Postpartum Care    History of Present Illness: Patient is a 20 y.o. G1P0101 presents for postpartum visit.  Date of delivery: 05/15/2021 Type of delivery: C-section due to malpresentation of fetus Episiotomy No.  Laceration: not applicable  Pregnancy or labor problems:  preterm prelabor rupture of membranes at 36 weeks, malpresentation of fetus Any problems since the delivery:  no  Newborn Details:  SINGLETON :  1. Baby's name: Minette Brine. Birth weight: 2,390 grams (5 lbs 4 oz) Maternal Details:  Breast Feeding:  yes Post partum depression/anxiety noted:  no Edinburgh Post-Partum Depression Score:  6  Date of last PAP: unsure? Maybe done in [place]. Unable to find records.   Past Medical History:  Diagnosis Date   Arcuate uterus    noted on c-section, should not labor based on findings during c-section   Elevated liver enzymes    Family history of adverse reaction to anesthesia    PT'S MOM STATES IT TAKES MORE TO SEDATE HER FOR HER PAST SURGERIES    Past Surgical History:  Procedure Laterality Date   CESAREAN SECTION  05/15/2021   CESAREAN SECTION;  Surgeon: Will Bonnet, MD;  Very thin uterine wall. DO NOT RECOMMEND LABOR IN FUTURE   CHOLECYSTECTOMY N/A 06/09/2016   Procedure: LAPAROSCOPIC CHOLECYSTECTOMY WITH INTRAOPERATIVE CHOLANGIOGRAM;  Surgeon: Robert Bellow, MD;  Location: [facility] ORS;  Service: General;  Laterality: N/A;   TEAR DUCT PROBING  08/21/2001   WISDOM TOOTH EXTRACTION      Prior to Admission medications   Medication Sig Start Date End Date Taking? Authorizing Provider  omeprazole (PRILOSEC) 10 MG capsule Take 10 mg by mouth daily.   Yes [provider]  Prenatal Vit-Fe Fumarate-FA (PRENATAL PO) Take by mouth.   Yes [provider]    Allergies  Allergen Reactions   Penicillins Hives    Has patient had a PCN reaction causing immediate rash, facial/tongue/throat  swelling, SOB or lightheadedness with hypotension: Yes Has patient had a PCN reaction causing severe rash involving mucus membranes or skin necrosis: No Has patient had a PCN reaction that required hospitalization No Has patient had a PCN reaction occurring within the last 10 years: Yes If all of the above answers are "NO", then may proceed with Cephalosporin use. Has patient had a PCN reaction causing immediate rash, facial/tongue/throat swelling, SOB or lightheadedness with hypotension: Yes Has patient had a PCN reaction causing severe rash involving mucus membranes or skin necrosis: No Has patient had a PCN reaction that required hospitalization No Has patient had a PCN reaction occurring within the last 10 years: Yes If all of the above answers are "NO", then may proceed with Cephalosporin use.      Social History   Socioeconomic History   Marital status: Married    Spouse name: Rodman Key   Number of children: Not on file   Years of education: Not on file   Highest education level: Not on file  Occupational History   Not on file  Tobacco Use   Smoking status: Never   Smokeless tobacco: Never  Vaping Use   Vaping Use: Never used  Substance and Sexual Activity   Alcohol use: No    Alcohol/week: 0.0 standard drinks   Drug use: No   Sexual activity: Yes    Birth control/protection: None  Other Topics Concern   Not on file  Social History Narrative   11th grade - the [employer]  One brother      Plays basketball      Social Determinants of Health   Financial Resource Strain: Not on file  Food Insecurity: Not on file  Transportation Needs: Not on file  Physical Activity: Not on file  Stress: Not on file  Social Connections: Not on file  Intimate Partner Violence: Not on file    Family History  Problem Relation Age of Onset   Breast cancer Maternal Aunt 40       times two great aunts   Breast cancer Paternal Aunt 82       one    Arthritis Maternal  Grandmother    Hyperlipidemia Maternal Grandfather    Diabetes Mellitus II Maternal Grandfather    Diabetes Paternal Grandfather     Review of Systems  Constitutional: Negative.   HENT: Negative.    Eyes: Negative.   Respiratory: Negative.    Cardiovascular: Negative.   Gastrointestinal: Negative.   Genitourinary: Negative.   Musculoskeletal: Negative.   Skin: Negative.   Neurological: Negative.   Psychiatric/Behavioral: Negative.      Physical Exam BP 118/73   Ht 5\' 8"  (1.727 m)   Wt 184 lb (83.5 kg)   Breastfeeding Yes   BMI 27.98 kg/m   Physical Exam Constitutional:      General: She is not in acute distress.    Appearance: Normal appearance. She is well-developed.  Genitourinary:     Vulva, bladder and urethral meatus normal.     No lesions in the vagina.     Right Labia: No rash, tenderness, lesions, skin changes or Bartholin's cyst.    Left Labia: No tenderness, lesions, skin changes, Bartholin's cyst or rash.    No inguinal adenopathy present in the right or left side.    Pelvic Tanner Score: 5/5.    No vaginal discharge, erythema, tenderness or bleeding.     No vaginal prolapse present.    No vaginal atrophy present.     Right Adnexa: not tender, not full and no mass present.    Left Adnexa: not tender, not full and no mass present.    No cervical motion tenderness, friability, lesion or polyp.     Uterus is not enlarged, fixed or tender.     Uterus is anteverted.     No urethral tenderness or mass present.     Pelvic exam was performed with patient in the lithotomy position.  HENT:     Head: Normocephalic and atraumatic.  Eyes:     General: No scleral icterus.    Conjunctiva/sclera: Conjunctivae normal.  Cardiovascular:     Rate and Rhythm: Normal rate and regular rhythm.     Heart sounds: No murmur heard.   No friction rub. No gallop.  Pulmonary:     Effort: Pulmonary effort is normal. No respiratory distress.     Breath sounds: Normal breath  sounds. No wheezing or rales.  Abdominal:     General: Bowel sounds are normal. There is no distension.     Palpations: Abdomen is soft. There is no mass.     Tenderness: There is no abdominal tenderness. There is no guarding or rebound.     Hernia: There is no hernia in the left inguinal area or right inguinal area.     Comments: Incision: without erythema, induration, warmth, and tenderness. It is clean, dry, and intact.   Musculoskeletal:        General: Normal range of motion.     Cervical back:  Normal range of motion and neck supple.  Lymphadenopathy:     Lower Body: No right inguinal adenopathy. No left inguinal adenopathy.  Neurological:     General: No focal deficit present.     Mental Status: She is alert and oriented to person, place, and time.     Cranial Nerves: No cranial nerve deficit.  Skin:    General: Skin is warm and dry.     Findings: No erythema.  Psychiatric:        Mood and Affect: Mood normal.        Behavior: Behavior normal.        Judgment: Judgment normal.    Female Chaperone present during breast and/or pelvic exam.   GYNECOLOGY PROCEDURE NOTE  Patient is a 20 y.o. G1P0101 presenting for Nexplanon insertion as her desires means of contraception.  She provided informed consent, signed copy in the chart, time out was performed. Pregnancy test was negative, with self reported LMP of No LMP recorded.  She understands that Nexplanon is a progesterone only therapy, and that patients often patients have irregular and unpredictable vaginal bleeding or amenorrhea. She understands that other side effects are possible related to systemic progesterone, including but not limited to, headaches, breast tenderness, nausea, and irritability. While effective at preventing pregnancy long acting reversible contraceptives do not prevent transmission of sexually transmitted diseases and use of barrier methods for this purpose was discussed. The placement procedure for Nexplanon  was reviewed with the patient in detail including risks of nerve injury, infection, bleeding and injury to other muscles or tendons. She understands that the Nexplanon implant is good for 3 years and needs to be removed at the end of that time.  She understands that Nexplanon is an extremely effective option for contraception, with failure rate of <1%. This information is reviewed today and all questions were answered. Informed consent was obtained, both verbally and written.   The patient is healthy and has no contraindications to Nexplanon use. Urine pregnancy test was performed today and was negative.  Procedure Appropriate time out taken.  Patient placed in dorsal supine with left arm above head, elbow flexed at 90 degrees, arm resting on examination table with hand behind her head.  The bicipital grove was palpated and site 8-10cm proximal to the medial epicondyle was indentified.  Per the manufacturer's recommendations, the insertion site was marked along a line 3-5 cm posterior (toward the triceps) to the bicipital groove and at 8-10 cm medial to the medial epicondyle. The insertion site was prepped with a two betadine swabs and then injected with 3 mL of 1% lidocaine without epinephrine.  Nexplanon removed form sterile blister packaging,  Device confirmed in needle, before inserting full length of needle, tenting up the skin as the needle was advance.  The drug eluting rod was then deployed by pulling back the slider per the manufactures recommendation.  The implant was palpable by the clinician as well as the patient.  The insertion site covered dressed with a 1/2" steri-strip before applying  a kerlex bandage pressure dressing..Minimal blood loss was noted during the procedure.  The patient tolerated the procedure well.   She was instructed to wear the bandage for 24 hours, call with any signs of infection.  She was given the Nexplanon card and instructed to have the rod removed in 3  years.  Assessment: 20 y.o. 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  Patient opts for routine screening interval. Pap performed today.   3) Patient underwent screening for postpartum depression with no concerns noted.  4) Follow up 1 year for routine annual exam  Prentice Docker, MD 06/30/2021 12:35 PM

## 2021-07-01 NOTE — Telephone Encounter (Signed)
Patient decided Nexplanon. Nexplanon rcvd/charged 06/30/21

## 2021-07-08 LAB — CYTOLOGY - PAP
Chlamydia: NEGATIVE
Comment: NEGATIVE
Comment: NEGATIVE
Comment: NORMAL
Diagnosis: UNDETERMINED — AB
High risk HPV: NEGATIVE
Neisseria Gonorrhea: NEGATIVE

## 2021-08-09 ENCOUNTER — Other Ambulatory Visit: Payer: Self-pay

## 2021-08-09 ENCOUNTER — Ambulatory Visit (INDEPENDENT_AMBULATORY_CARE_PROVIDER_SITE_OTHER): Payer: BC Managed Care – PPO | Admitting: Obstetrics and Gynecology

## 2021-08-09 ENCOUNTER — Encounter: Payer: Self-pay | Admitting: Obstetrics and Gynecology

## 2021-08-09 VITALS — BP 110/70 | Ht 68.0 in | Wt 187.0 lb

## 2021-08-09 DIAGNOSIS — N898 Other specified noninflammatory disorders of vagina: Secondary | ICD-10-CM | POA: Diagnosis not present

## 2021-08-09 LAB — POCT WET PREP WITH KOH
Clue Cells Wet Prep HPF POC: NEGATIVE
KOH Prep POC: NEGATIVE
Trichomonas, UA: NEGATIVE
Yeast Wet Prep HPF POC: NEGATIVE

## 2021-08-09 NOTE — Progress Notes (Signed)
Burnard Hawthorne, FNP   Chief Complaint  Patient presents with   Vaginal Odor    Abnormal, discharge and itchiness on/off x 3 months    HPI:      [name] is a 20 y.o. G1P0101 whose LMP was No LMP recorded. Patient has had an implant., presents today for increased d/c with odor, occas itch, for past few months. No meds to treat, no urin sx. No LBP, pelvic pain, fevers. Had similar sx prior to pregnancy in [place]. Treated with flagyl and metrogel without sx relief. Was told it was similar to "ball sweat" smell. No sx during pregnancy but has since recurred. Pt using water to wash, occas dryer sheets, wearing cotton underwear, no thong, no wipes.  S/p CS 05/15/21; nexplanon placed 06/30/21; ASCUS/neg HPV DNA pap 06/30/21; neg STD 11/22  PT IS BREASTFEEDING  Patient Active Problem List   Diagnosis Date Noted   [redacted] weeks gestation of pregnancy 05/15/2021   Preterm premature rupture of membranes in third trimester 05/15/2021   Malpresentation of fetus, antepartum 05/15/2021   Abdominal pain during pregnancy in third trimester 05/14/2021   [redacted] weeks gestation of pregnancy 05/14/2021   Malpresentation before onset of labor, not applicable or unspecified fetus 05/14/2021   Labor and delivery, indication for care 05/06/2021   Rh negative state in antepartum period 03/21/2021   Encounter for supervision of normal first pregnancy in third trimester 12/25/2020   Family history of supraventricular tachycardia 04/25/2017   GAD (generalized anxiety disorder) 04/25/2017   Cholecystitis 06/05/2016    Past Surgical History:  Procedure Laterality Date   CESAREAN SECTION  05/15/2021   CESAREAN SECTION;  Surgeon: Will Bonnet, MD;  Very thin uterine wall. DO NOT RECOMMEND LABOR IN FUTURE   CHOLECYSTECTOMY N/A 06/09/2016   Procedure: LAPAROSCOPIC CHOLECYSTECTOMY WITH INTRAOPERATIVE CHOLANGIOGRAM;  Surgeon: Robert Bellow, MD;  Location: [facility] ORS;  Service: General;   Laterality: N/A;   TEAR DUCT PROBING  08/21/2001   WISDOM TOOTH EXTRACTION      Family History  Problem Relation Age of Onset   Breast cancer Maternal Aunt 50       times two great aunts   Breast cancer Paternal Aunt 38       one    Arthritis Maternal Grandmother    Hyperlipidemia Maternal Grandfather    Diabetes Mellitus II Maternal Grandfather    Diabetes Paternal Grandfather     Social History   Socioeconomic History   Marital status: Married    Spouse name: Rodman Key   Number of children: Not on file   Years of education: Not on file   Highest education level: Not on file  Occupational History   Not on file  Tobacco Use   Smoking status: Never   Smokeless tobacco: Never  Vaping Use   Vaping Use: Never used  Substance and Sexual Activity   Alcohol use: No    Alcohol/week: 0.0 standard drinks   Drug use: No   Sexual activity: Yes    Birth control/protection: Implant  Other Topics Concern   Not on file  Social History Narrative   11th grade - [employer]      One brother      Plays basketball      Social Determinants of Health   Financial Resource Strain: Not on file  Food Insecurity: Not on file  Transportation Needs: Not on file  Physical Activity: Not on file  Stress: Not on file  Social Connections: Not on file  Intimate Partner Violence: Not on file    Outpatient Medications Prior to Visit  Medication Sig Dispense Refill   etonogestrel (NEXPLANON) 68 MG IMPL implant 1 each (68 mg total) by Subdermal route. 1 each 0   Prenatal Vit-Fe Fumarate-FA (PRENATAL PO) Take by mouth.     omeprazole (PRILOSEC) 10 MG capsule Take 10 mg by mouth daily.     No facility-administered medications prior to visit.      ROS:  Review of Systems  Constitutional:  Negative for fever.  Gastrointestinal:  Negative for blood in stool, constipation, diarrhea, nausea and vomiting.  Genitourinary:  Positive for vaginal discharge. Negative for dyspareunia,  dysuria, flank pain, frequency, hematuria, urgency, vaginal bleeding and vaginal pain.  Musculoskeletal:  Negative for back pain.  Skin:  Negative for rash.  BREAST: No symptoms   OBJECTIVE:   Vitals:  BP 110/70    Ht 5\' 8"  (1.727 m)    Wt 187 lb (84.8 kg)    Breastfeeding Yes    BMI 28.43 kg/m   Physical Exam Vitals reviewed.  Constitutional:      Appearance: She is well-developed.  Pulmonary:     Effort: Pulmonary effort is normal.  Genitourinary:    General: Normal vulva.     Pubic Area: No rash.      Labia:        Right: No rash, tenderness or lesion.        Left: No rash, tenderness or lesion.      Vagina: Bleeding present. No vaginal discharge, erythema or tenderness.     Cervix: Normal.     Uterus: Normal. Not enlarged and not tender.      Adnexa: Right adnexa normal and left adnexa normal.       Right: No mass or tenderness.         Left: No mass or tenderness.       Comments: SCANT AMT BLOOD; NO INCREASED VAG D/C Musculoskeletal:        General: Normal range of motion.     Cervical back: Normal range of motion.  Skin:    General: Skin is warm and dry.  Neurological:     General: No focal deficit present.     Mental Status: She is alert and oriented to person, place, and time.  Psychiatric:        Mood and Affect: Mood normal.        Behavior: Behavior normal.        Thought Content: Thought content normal.        Judgment: Judgment normal.    Results: Results for orders placed or performed in visit on 08/09/21 (from the past 24 hour(s))  POCT Wet Prep with KOH     Status: Normal   Collection Time: 08/09/21 10:33 AM  Result Value Ref Range   Trichomonas, UA Negative    Clue Cells Wet Prep HPF POC neg    Epithelial Wet Prep HPF POC     Yeast Wet Prep HPF POC neg    Bacteria Wet Prep HPF POC     RBC Wet Prep HPF POC     WBC Wet Prep HPF POC     KOH Prep POC Negative Negative     Assessment/Plan: Vaginal odor - Plan: NuSwab Vaginitis (VG), POCT Wet  Prep with KOH; pos sx, neg wet prep. Sx prior to pregnancy, not improved with metronidazole tx. Check nuswab. Will f/u with results. If neg, sx  are normal d/c. Make sure to avoid any scented products.   Vaginal discharge - Plan: NuSwab Vaginitis (VG), POCT Wet Prep with KOH  PT IS BREASTFEEDING    Return if symptoms worsen or fail to improve.  Tamikka Pilger B. Lastacia Solum, PA-C 08/09/2021 10:35 AM

## 2021-08-12 LAB — NUSWAB VAGINITIS (VG)
Candida albicans, NAA: NEGATIVE
Candida glabrata, NAA: NEGATIVE
Trich vag by NAA: NEGATIVE

## 2021-09-15 ENCOUNTER — Telehealth: Payer: BC Managed Care – PPO | Admitting: Physician Assistant

## 2021-09-15 ENCOUNTER — Ambulatory Visit: Payer: Self-pay

## 2021-09-15 DIAGNOSIS — B9789 Other viral agents as the cause of diseases classified elsewhere: Secondary | ICD-10-CM | POA: Diagnosis not present

## 2021-09-15 DIAGNOSIS — J019 Acute sinusitis, unspecified: Secondary | ICD-10-CM

## 2021-09-15 MED ORDER — FLUTICASONE PROPIONATE 50 MCG/ACT NA SUSP: 2.0000 | Freq: Every day | NASAL | 0 refills | Status: AC

## 2021-09-15 NOTE — Progress Notes (Signed)
E-Visit for Sinus Problems  We are sorry that you are not feeling well.  Here is how we plan to help!  Based on what you have shared with me it looks like you have sinusitis.  Sinusitis is inflammation and infection in the sinus cavities of the head.  Based on your presentation I believe you most likely have Acute Viral Sinusitis.This is an infection most likely caused by a virus. There is not specific treatment for viral sinusitis other than to help you with the symptoms until the infection runs its course.  You can use plain Mucinex to thin congestion. We want to avoid decongestants (pseudoephedrine, etc) as they can decrease breast milk production. Saline nasal spray help and can safely be used as often as needed for congestion, I have prescribed: Fluticasone nasal spray two sprays in each nostril once a day. Your GYN can also provide a comprehensive list of OTC medications safe to take while breastfeeding.   Some authorities believe that zinc sprays or the use of Echinacea may shorten the course of your symptoms.  Sinus infections are not as easily transmitted as other respiratory infection, however we still recommend that you avoid close contact with loved ones, especially the very young and elderly.  Remember to wash your hands thoroughly throughout the day as this is the number one way to prevent the spread of infection!  Home Care: Only take medications as instructed by your medical team. Do not take these medications with alcohol. A steam or ultrasonic humidifier can help congestion.  You can place a towel over your head and breathe in the steam from hot water coming from a faucet. Avoid close contacts especially the very young and the elderly. Cover your mouth when you cough or sneeze. Always remember to wash your hands.  Get Help Right Away If: You develop worsening fever or sinus pain. You develop a severe head ache or visual changes. Your symptoms persist after you have completed  your treatment plan.  Make sure you Understand these instructions. Will watch your condition. Will get help right away if you are not doing well or get worse.   Thank you for choosing an e-visit.  Your e-visit answers were reviewed by a board certified advanced clinical practitioner to complete your personal care plan. Depending upon the condition, your plan could have included both over the counter or prescription medications.  Please review your pharmacy choice. Make sure the pharmacy is open so you can pick up prescription now. If there is a problem, you may contact your provider through [employer] and have the prescription routed to another pharmacy.  Your safety is important to [place]. If you have drug allergies check your prescription carefully.   For the next 24 hours you can use MyChart to ask questions about today's visit, request a non-urgent call back, or ask for a work or school excuse. You will get an email in the next two days asking about your experience. I hope that your e-visit has been valuable and will speed your recovery.

## 2021-09-15 NOTE — Progress Notes (Signed)
I have spent 5 minutes in review of e-visit questionnaire, review and updating patient chart, medical decision making and response to patient.   Donevin Sainsbury Cody Yaasir Menken, PA-C    

## 2021-10-16 ENCOUNTER — Encounter: Payer: Self-pay | Admitting: Physician Assistant

## 2021-10-16 ENCOUNTER — Telehealth: Payer: BC Managed Care – PPO | Admitting: Physician Assistant

## 2021-10-16 DIAGNOSIS — K529 Noninfective gastroenteritis and colitis, unspecified: Secondary | ICD-10-CM

## 2021-10-16 NOTE — Progress Notes (Signed)
Virtual Visit Consent   Erika Hunt, you are scheduled for a virtual visit with a [facility] provider today.     Just as with appointments in the office, your consent must be obtained to participate.  Your consent will be active for this visit and any virtual visit you may have with one of our providers in the next 365 days.     If you have a MyChart account, a copy of this consent can be sent to you electronically.  All virtual visits are billed to your insurance company just like a traditional visit in the office.    As this is a virtual visit, video technology does not allow for your provider to perform a traditional examination.  This may limit your provider's ability to fully assess your condition.  If your provider identifies any concerns that need to be evaluated in person or the need to arrange testing (such as labs, EKG, etc.), we will make arrangements to do so.     Although advances in technology are sophisticated, we cannot ensure that it will always work on either your end or our end.  If the connection with a video visit is poor, the visit may have to be switched to a telephone visit.  With either a video or telephone visit, we are not always able to ensure that we have a secure connection.     I need to obtain your verbal consent now.   Are you willing to proceed with your visit today?    FATIN BACHICHA has provided verbal consent on 10/16/2021 for a virtual visit (video or telephone).   Kennieth Rad, PA-C   Date: 10/16/2021 10:58 AM   Virtual Visit via Video Note   I, Aldric Wenzler S Mayers, connected with  Erika Hunt  ([id number], Jan 07, 2001) on 10/16/21 at 11:00 AM EST by a video-enabled telemedicine application and verified that I am speaking with the correct person using two identifiers.  Location: Patient: Virtual Visit Location Patient: Home Provider: Virtual Visit Location Provider: Home Office   I discussed the limitations of evaluation and management by  telemedicine and the availability of in person appointments. The patient expressed understanding and agreed to proceed.    History of Present Illness: Erika Hunt is a 21 y.o. who identifies as a female who was assigned female at birth, states that she started having episodes of vomiting yesterday afternoon around 5:30 PM, states that she feels achy, had chills and unmeasured fever last night.  States that she continues to have episodes of vomiting, but has also had episodes of diarrhea starting today.  States that she has been trying to stay hydrated, has drank approximately 2 L of water and has been drinking body armor.  However when she drinks fluids it does cause vomiting.  States that she took Zofran yesterday without relief.  Denies sick contacts, does endorse that they went out to eat at 2 PM yesterday, is unsure if she had any type of suspect food.  Does endorse that she is currently breast-feeding.   HPI: HPI  Problems:  Patient Active Problem List   Diagnosis Date Noted   [redacted] weeks gestation of pregnancy 05/15/2021   Preterm premature rupture of membranes in third trimester 05/15/2021   Malpresentation of fetus, antepartum 05/15/2021   Abdominal pain during pregnancy in third trimester 05/14/2021   [redacted] weeks gestation of pregnancy 05/14/2021   Malpresentation before onset of labor, not applicable or unspecified fetus 05/14/2021  Labor and delivery, indication for care 05/06/2021   Rh negative state in antepartum period 03/21/2021   Encounter for supervision of normal first pregnancy in third trimester 12/25/2020   Family history of supraventricular tachycardia 04/25/2017   GAD (generalized anxiety disorder) 04/25/2017   Cholecystitis 06/05/2016    Allergies:  Allergies  Allergen Reactions   Penicillins Hives    Has patient had a PCN reaction causing immediate rash, facial/tongue/throat swelling, SOB or lightheadedness with hypotension: Yes Has patient had a PCN reaction  causing severe rash involving mucus membranes or skin necrosis: No Has patient had a PCN reaction that required hospitalization No Has patient had a PCN reaction occurring within the last 10 years: Yes If all of the above answers are "NO", then may proceed with Cephalosporin use. Has patient had a PCN reaction causing immediate rash, facial/tongue/throat swelling, SOB or lightheadedness with hypotension: Yes Has patient had a PCN reaction causing severe rash involving mucus membranes or skin necrosis: No Has patient had a PCN reaction that required hospitalization No Has patient had a PCN reaction occurring within the last 10 years: Yes If all of the above answers are "NO", then may proceed with Cephalosporin use.    Medications:  Current Outpatient Medications:    etonogestrel (NEXPLANON) 68 MG IMPL implant, 1 each (68 mg total) by Subdermal route., Disp: 1 each, Rfl: 0   fluticasone (FLONASE) 50 MCG/ACT nasal spray, Place 2 sprays into both nostrils daily., Disp: 16 g, Rfl: 0   Prenatal Vit-Fe Fumarate-FA (PRENATAL PO), Take by mouth., Disp: , Rfl:   Observations/Objective: Patient is well-developed, well-nourished in no acute distress.  Resting comfortably  at home.  Head is normocephalic, atraumatic.  No labored breathing. Speech is clear and coherent with logical content.  Patient is alert and oriented at baseline.    Assessment and Plan: 1. Gastroenteritis  Gastroenteritis versus food poisoning.  Patient education given on supportive care, patient encouraged to continue pushing fluids, follow brat diet as tolerated.  Red flags given for prompt reevaluation.  Follow Up Instructions: I discussed the assessment and treatment plan with the patient. The patient was provided an opportunity to ask questions and all were answered. The patient agreed with the plan and demonstrated an understanding of the instructions.  A copy of instructions were sent to the patient via MyChart unless  otherwise noted below.     The patient was advised to call back or seek an in-person evaluation if the symptoms worsen or if the condition fails to improve as anticipated.  Time:  I spent 12 minutes with the patient via telehealth technology discussing the above problems/concerns.    Loraine Grip Mayers, PA-C

## 2021-10-16 NOTE — Patient Instructions (Signed)
Erika Hunt, thank you for joining Kennieth Rad, PA-C for today's virtual visit.  While this provider is not your primary care provider (PCP), if your PCP is located in our provider database this encounter information will be shared with them immediately following your visit.  Consent: (Patient) Erika Hunt provided verbal consent for this virtual visit at the beginning of the encounter.  Current Medications:  Current Outpatient Medications:    etonogestrel (NEXPLANON) 68 MG IMPL implant, 1 each (68 mg total) by Subdermal route., Disp: 1 each, Rfl: 0   fluticasone (FLONASE) 50 MCG/ACT nasal spray, Place 2 sprays into both nostrils daily., Disp: 16 g, Rfl: 0   Prenatal Vit-Fe Fumarate-FA (PRENATAL PO), Take by mouth., Disp: , Rfl:    Medications ordered in this encounter:  No orders of the defined types were placed in this encounter.    *If you need refills on other medications prior to your next appointment, please contact your pharmacy*  Follow-Up: Call back or seek an in-person evaluation if the symptoms worsen or if the condition fails to improve as anticipated.  Other Instructions I encourage you to continue pushing fluids, follow a BRAT diet as described below and advance your diet as tolerated.  I hope that you feel better soon.  Please let us know if there is any else we can do for you.   If you have been instructed to have an in-person evaluation today at a local Urgent Care facility, please use the link below. It will take you to a list of all of our available [facility], including address, phone number and hours of operation. Please do not delay care.  [facility]  If you or a family member do not have a primary care provider, use the link below to schedule a visit and establish care. When you choose a [facility] primary care physician or advanced practice provider, you gain a long-term partner in health. Find a Primary Care  Provider  Learn more about [facility]'s in-office and virtual care options: Ladd poisoning is an illness that is caused by eating or drinking contaminated foods or drinks. In most cases, food poisoning is mild and lasts 1-2 days. However, some cases can be serious, especially for people who have weak body defense systems (immune systems), older people, children and infants, and pregnant women. What are the causes? This condition is caused by contaminated food. Foods can become contaminated with viruses, bacteria, parasites, or mold due to: Poor personal hygiene, such as poor hand-washing practices. Storing food improperly, such as not refrigerating raw meat. Using unclean surfaces for preparing, serving, and storing food. Cooking or eating with unclean utensils. If contaminated food is eaten, viruses, bacteria, or parasites can harm the intestine. This often causes severe diarrhea. The most common causes of food poisoning include: Viruses, such as: Norovirus. Rotavirus. Bacteria, such as: Salmonella. Listeria. E. coli (Escherichia coli). Parasites, such as: Giardia. Toxoplasma gondii. What are the signs or symptoms? Symptoms may take several hours to appear after you consume contaminated food or drink. Symptoms include: Nausea. Vomiting. Cramping. Diarrhea. Fever and chills. Muscle aches. Dehydration. Dehydration can cause you to be tired and thirsty, have a dry mouth, and urinate less frequently. How is this diagnosed? Your health care provider can diagnose food poisoning with your medical history and a physical exam. This will include asking you what you have recently eaten. You may also have  tests, including: Blood tests. Stool tests. How is this treated? Treatment focuses on relieving your symptoms and making sure that you are hydrated. You may also be given medicines. In severe cases, hospitalization may be required and you may  need to receive fluids through an IV. Follow these instructions at home: Eating and drinking  Drink enough fluids to keep your urine pale yellow. You may need to drink small amounts of clear liquids frequently. Avoid milk, caffeine, and alcohol. Ask your health care provider for specific rehydration instructions. Eat small, frequent meals rather than large meals. Medicines Take over-the-counter and prescription medicines only as told by your health care provider. Ask your health care provider if you should continue to take any of your regular prescribed and over-the-counter medicines. If you were prescribed an antibiotic medicine, take it as told by your health care provider. Do not stop taking the antibiotic even if you start to feel better. General instructions  Wash your hands thoroughly before you prepare food and after you go to the bathroom (use the toilet). Make sure that the people who live with you also wash their hands often. Rest at home until you feel better. Clean surfaces that you touch with a product that contains chlorine bleach. Keep all follow-up visits as told by your health care provider. This is important. How is this prevented? Wash your hands, food preparation surfaces, and utensils thoroughly before and after you handle raw foods. Use separate food preparation surfaces and storage spaces for raw meat and for fruits and vegetables. Keep refrigerated foods colder than 3F (5C). Serve hot foods immediately or keep them heated above 13F (60C). Store dry foods in cool, dry spaces away from excess heat or moisture. Throw out any foods that do not smell right or are in cans that are bulging. Follow approved canning procedures. Heat canned foods thoroughly before you taste them. Drink bottled or sterile water when you travel. Get help right away if: You have difficulty breathing, swallowing, talking, or moving. You develop blurred vision. You cannot eat or drink  without vomiting. You faint. Your eyes turn yellow. Your vomiting or diarrhea is persistent. Abdominal pain develops, increases, or localizes in one small area. You have a fever. You have blood or mucus in your stools, or your stools look dark black and tarry. You have signs of dehydration, such as: Dark urine, very little urine, or no urine. Cracked lips. Not making tears while crying. Dry mouth. Sunken eyes. Sleepiness. Weakness. Dizziness. These symptoms may represent a serious problem that is an emergency. Do not wait to see if the symptoms will go away. Get medical help right away. Call your local emergency services (911 in the [place] Do not drive yourself to the hospital. Summary Food poisoning is an illness that is caused by eating or drinking contaminated foods or drinks. Symptoms may include nausea, vomiting, diarrhea, muscle aches, cramping, fever, chills, and dehydration. In most cases, food poisoning is mild and lasts 1-2 days. In severe cases, hospitalization may be required. This information is not intended to replace advice given to you by your health care provider. Make sure you discuss any questions you have with your health care provider. Document Revised: 05/22/2018 Document Reviewed: 05/22/2018 Elsevier Patient Education  2022 [place].  Diarrhea, Adult Diarrhea is frequent loose and watery bowel movements. Diarrhea can make you feel weak and cause you to become dehydrated. Dehydration can make you tired and thirsty, cause you to have a dry mouth, and decrease how  often you urinate. Diarrhea typically lasts 2-3 days. However, it can last longer if it is a sign of something more serious. It is important to treat your diarrhea as told by your health care provider. Follow these instructions at home: Eating and drinking   Follow these recommendations as told by your health care provider: Take an oral rehydration solution (ORS). This is an over-the-counter  medicine that helps return your body to its normal balance of nutrients and water. It is found at pharmacies and retail stores. Drink plenty of fluids, such as water, ice chips, diluted fruit juice, and low-calorie sports drinks. You can drink milk also, if desired. Avoid drinking fluids that contain a lot of sugar or caffeine, such as energy drinks, sports drinks, and soda. Eat bland, easy-to-digest foods in small amounts as you are able. These foods include bananas, applesauce, rice, lean meats, toast, and crackers. Avoid alcohol. Avoid spicy or fatty foods.  Medicines Take over-the-counter and prescription medicines only as told by your health care provider. If you were prescribed an antibiotic medicine, take it as told by your health care provider. Do not stop using the antibiotic even if you start to feel better. General instructions  Wash your hands often using soap and water. If soap and water are not available, use a hand sanitizer. Others in the household should wash their hands as well. Hands should be washed: After using the toilet or changing a diaper. Before preparing, cooking, or serving food. While caring for a sick person or while visiting someone in a hospital. Drink enough fluid to keep your urine pale yellow. Rest at home while you recover. Watch your condition for any changes. Take a warm bath to relieve any burning or pain from frequent diarrhea episodes. Keep all follow-up visits as told by your health care provider. This is important. Contact a health care provider if: You have a fever. Your diarrhea gets worse. You have new symptoms. You cannot keep fluids down. You feel light-headed or dizzy. You have a headache. You have muscle cramps. Get help right away if: You have chest pain. You feel extremely weak or you faint. You have bloody or black stools or stools that look like tar. You have severe pain, cramping, or bloating in your abdomen. You have trouble  breathing or you are breathing very quickly. Your heart is beating very quickly. Your skin feels cold and clammy. You feel confused. You have signs of dehydration, such as: Dark urine, very little urine, or no urine. Cracked lips. Dry mouth. Sunken eyes. Sleepiness. Weakness. Summary Diarrhea is frequent loose and sometimes watery bowel movements. Diarrhea can make you feel weak and cause you to become dehydrated. Drink enough fluids to keep your urine pale yellow. Make sure that you wash your hands after using the toilet. If soap and water are not available, use hand sanitizer. Contact a health care provider if your diarrhea gets worse or you have new symptoms. Get help right away if you have signs of dehydration. This information is not intended to replace advice given to you by your health care provider. Make sure you discuss any questions you have with your health care provider. Document Revised: 02/16/2021 Document Reviewed: 02/16/2021 Elsevier Patient Education  [place].

## 2021-11-29 ENCOUNTER — Telehealth: Payer: Self-pay

## 2021-11-29 ENCOUNTER — Telehealth: Payer: Self-pay | Admitting: Family

## 2021-11-29 NOTE — Telephone Encounter (Signed)
[place] office to get patient an appointment to re establish care ?

## 2021-11-29 NOTE — Telephone Encounter (Signed)
Patient has not been seen since 2018, she was away at college. She would like to re-establish with [facility] ?

## 2021-11-29 NOTE — Telephone Encounter (Signed)
[place] office to get patient an appointment to re establish care ?

## 2021-12-02 ENCOUNTER — Ambulatory Visit: Payer: BC Managed Care – PPO | Admitting: Family

## 2022-01-02 ENCOUNTER — Encounter: Payer: Self-pay | Admitting: Family

## 2022-01-02 ENCOUNTER — Ambulatory Visit: Payer: BC Managed Care – PPO | Admitting: Family

## 2022-01-02 VITALS — BP 115/80 | HR 82 | Temp 98.1°F | Ht 68.0 in | Wt 190.8 lb

## 2022-01-02 DIAGNOSIS — K219 Gastro-esophageal reflux disease without esophagitis: Secondary | ICD-10-CM

## 2022-01-02 DIAGNOSIS — R519 Headache, unspecified: Secondary | ICD-10-CM | POA: Diagnosis not present

## 2022-01-02 DIAGNOSIS — F411 Generalized anxiety disorder: Secondary | ICD-10-CM

## 2022-01-02 MED ORDER — MAGNESIUM CITRATE 200 MG PO TABS: 400.0000 mg | ORAL_TABLET | Freq: Every day | ORAL | 1 refills | Status: AC

## 2022-01-02 MED ORDER — FAMOTIDINE 20 MG PO TABS: 20.0000 mg | ORAL_TABLET | Freq: Two times a day (BID) | ORAL | 1 refills | Status: DC

## 2022-01-02 NOTE — Progress Notes (Signed)
? ?Subjective:  ? ? Patient ID: Erika Hunt, female    DOB: 2001-03-19, 21 y.o.   MRN: [id number] ? ?CC: Erika Hunt is a 21 y.o. female who presents today to reestablish care, last seen in our practice  06/05/2017.  ? ?HPI: Complains of HA every other day x one month,  unchanged ? ?Behind eyes, middle of head. Dull HA.  ?No particular pattern as it relates to trigger/time of day.  ?Sleeping well.  ?1 cup coffee or soda per day. Not drinking a lot of water.  ?Takes tylenol once per week.  ?HA is not positional nor exertional. HA is not worse HA of life.  ? ?No h/o GM HTN ?No loss of vision, vomiting, fever associated with HA.  ? ?No h/o migraine ? ?No recent eye exam. Trouble with distance vision such as sign when driving ? ?Complains of 'indigestion' her 'whole life', episodic. ?She will vomit bile, bitter taste.  ? ?No particular food triggers ? ?She has tried protonix during pregnancy. ? ? ?No pain or trouble swallowing, choking, fever, abdominal pain ? ? ?Following with GYN, Erika Hunt.  Recently seen an increase Erika Hunt to 100 mg. Rare use of atarax 25 mg as needed. She feels well on this regimen.  ? ?She is breastfeeding ?Erika Hunt born 06/2023 ? ?HISTORY:  ?Past Medical History:  ?Diagnosis Date  ? Arcuate uterus   ? noted on c-section, should not labor based on findings during c-section  ? Elevated liver enzymes   ? Family history of adverse reaction to anesthesia   ? PT'S MOM STATES IT TAKES MORE TO SEDATE HER FOR HER PAST SURGERIES  ? ?Past Surgical History:  ?Procedure Laterality Date  ? CESAREAN SECTION  05/15/2021  ? CESAREAN SECTION;  Surgeon: Will Bonnet, MD;  Very thin uterine wall. DO NOT RECOMMEND LABOR IN FUTURE  ? CHOLECYSTECTOMY N/A 06/09/2016  ? Procedure: LAPAROSCOPIC CHOLECYSTECTOMY WITH INTRAOPERATIVE CHOLANGIOGRAM;  Surgeon: Robert Bellow, MD;  Location: [facility] ORS;  Service: General;  Laterality: N/A;  ? TEAR DUCT PROBING  08/21/2001  ? WISDOM TOOTH EXTRACTION    ? ?Family  History  ?Problem Relation Age of Onset  ? Colon cancer Mother 73  ? Arthritis Maternal Grandmother   ?     degenerative disc disease, now paralyzed.  ? Hyperlipidemia Maternal Grandfather   ? Diabetes Mellitus II Maternal Grandfather   ? Coronary artery disease Paternal Grandmother   ? Diabetes Paternal Grandfather   ? Coronary artery disease Paternal Grandfather   ? Breast cancer Maternal Aunt 86  ?     times two great aunts  ? Breast cancer Paternal Aunt 53  ?     one   ? ? ?Allergies: Penicillins ?Current Outpatient Medications on File Prior to Visit  ?Medication Sig Dispense Refill  ? etonogestrel (NEXPLANON) 68 MG IMPL implant 1 each (68 mg total) by Subdermal route. 1 each 0  ? sertraline (Erika Hunt) 100 MG tablet Take 100 mg by mouth daily.    ? fluticasone (FLONASE) 50 MCG/ACT nasal spray Place 2 sprays into both nostrils daily. (Patient not taking: Reported on 01/02/2022) 16 g 0  ? Prenatal Vit-Fe Fumarate-FA (PRENATAL PO) Take by mouth. (Patient not taking: Reported on 01/02/2022)    ? ?No current facility-administered medications on file prior to visit.  ? ? ?Social History  ? ?Tobacco Use  ? Smoking status: Never  ? Smokeless tobacco: Never  ?Vaping Use  ? Vaping Use: Never used  ?Substance  Use Topics  ? Alcohol use: Yes  ?  Comment: very rare  ? Drug use: No  ? ? ?Review of Systems  ?Constitutional:  Negative for chills and fever.  ?Eyes:  Negative for visual disturbance.  ?Respiratory:  Negative for cough.   ?Cardiovascular:  Negative for chest pain and palpitations.  ?Gastrointestinal:  Positive for vomiting. Negative for abdominal distention and nausea.  ?Neurological:  Positive for headaches. Negative for dizziness.  ?   ?Objective:  ?  ?BP 115/80   Pulse 82   Temp 98.1 ?F (36.7 ?C) (Oral)   Ht '5\' 8"'$  (1.727 m)   Wt 190 lb 12.8 oz (86.5 kg)   LMP  (LMP Unknown)   SpO2 99%   BMI 29.01 kg/m?  ?BP Readings from Last 3 Encounters:  ?01/02/22 115/80  ?08/09/21 110/70  ?06/30/21 118/73  ? ?Wt Readings  from Last 3 Encounters:  ?01/02/22 190 lb 12.8 oz (86.5 kg)  ?08/09/21 187 lb (84.8 kg)  ?06/30/21 184 lb (83.5 kg)  ? ? ?Physical Exam ?Vitals reviewed.  ?Constitutional:   ?   Appearance: She is well-developed.  ?HENT:  ?   Mouth/Throat:  ?   Pharynx: Uvula midline.  ?Eyes:  ?   Conjunctiva/sclera: Conjunctivae normal.  ?   Pupils: Pupils are equal, round, and reactive to light.  ?   Comments: Fundus normal bilaterally.   ?Cardiovascular:  ?   Rate and Rhythm: Normal rate and regular rhythm.  ?   Pulses: Normal pulses.  ?   Heart sounds: Normal heart sounds.  ?Pulmonary:  ?   Effort: Pulmonary effort is normal.  ?   Breath sounds: Normal breath sounds. No wheezing, rhonchi or rales.  ?Skin: ?   General: Skin is warm and dry.  ?Neurological:  ?   Mental Status: She is alert.  ?   Cranial Nerves: No cranial nerve deficit.  ?   Sensory: No sensory deficit.  ?   Deep Tendon Reflexes:  ?   Reflex Scores: ?     Bicep reflexes are 2+ on the right side and 2+ on the left side. ?     Patellar reflexes are 2+ on the right side and 2+ on the left side. ?   Comments: Grip equal and strong bilateral upper extremities. Gait strong and steady. Able to perform rapid alternating movement without difficulty.   ?Psychiatric:     ?   Speech: Speech normal.     ?   Behavior: Behavior normal.     ?   Thought Content: Thought content normal.  ? ? ?   ?Assessment & Plan:  ? ?Problem List Items Addressed This Visit   ? ?  ? Digestive  ? GERD (gastroesophageal reflux disease)  ?  Uncontrolled. Advised to start pepcid ac '20mg'$  BID which is safe during breastfeeding. Referral to Dr Vicente Males for longstanding severe GERD.  ? ?  ?  ? Relevant Medications  ? famotidine (PEPCID) 20 MG tablet  ? Other Relevant Orders  ? Ambulatory referral to Gastroenterology  ?  ? Other  ? GAD (generalized anxiety disorder)  ?  Chronic , stable.  She is following with OB GYN, Erika Hunt for this.  Continue Erika Hunt 100 mg, Atarax 25 mg as needed ? ?  ?  ? Relevant  Medications  ? sertraline (Erika Hunt) 100 MG tablet  ? Headache - Primary  ?  Blood pressure improved as patient rested in the room so I doubt blood pressure to be  culprit for headache today.  Discussed importance of drinking more water.  Reassuring neurologic exam today and no alarm features at this time.  Counseled patient on alarm features.  She is most agreeable to trial of preventative magnesium citrate 400 mg QD which is safe when breastfeeding.   consider propranolol and neuroimaging at follow-up. ? ?  ?  ? Relevant Medications  ? sertraline (Erika Hunt) 100 MG tablet  ? Magnesium Citrate 200 MG TABS  ? ? ? ?I am having Kaleisha T. Twersky start on famotidine and Magnesium Citrate. I am also having her maintain her Prenatal Vit-Fe Fumarate-FA (PRENATAL PO), Nexplanon, fluticasone, and sertraline. ? ? ?Meds ordered this encounter  ?Medications  ? famotidine (PEPCID) 20 MG tablet  ?  Sig: Take 1 tablet (20 mg total) by mouth 2 (two) times daily. Prior to breakfast and prior to bedtime  ?  Dispense:  60 tablet  ?  Refill:  1  ?  Order Specific Question:   Supervising Provider  ?  Answer:   Crecencio Mc [[id number]]  ? Magnesium Citrate 200 MG TABS  ?  Sig: Take 400 mg by mouth daily.  ?  Dispense:  120 tablet  ?  Refill:  1  ?  Order Specific Question:   Supervising Provider  ?  Answer:   Crecencio Mc [[id number]]  ? ? ?Return precautions given.  ? ?Risks, benefits, and alternatives of the medications and treatment plan prescribed today were discussed, and patient expressed understanding.  ? ?Education regarding symptom management and diagnosis given to patient on AVS. ? ?Continue to follow with Burnard Hawthorne, FNP for routine health maintenance.  ? ?Jerrye Beavers and I agreed with plan.  ? ?Mable Paris, FNP ? ? ? ?

## 2022-01-02 NOTE — Assessment & Plan Note (Signed)
Chronic , stable.  She is following with OB GYN, Dr. Glennon Mac for this.  Continue Zoloft 100 mg, Atarax 25 mg as needed ?

## 2022-01-02 NOTE — Assessment & Plan Note (Signed)
Blood pressure improved as patient rested in the room so I doubt blood pressure to be culprit for headache today.  Discussed importance of drinking more water.  Reassuring neurologic exam today and no alarm features at this time.  Counseled patient on alarm features.  She is most agreeable to trial of preventative magnesium citrate 400 mg QD which is safe when breastfeeding.   consider propranolol and neuroimaging at follow-up. ?

## 2022-01-02 NOTE — Patient Instructions (Signed)
Start magnesium citrate ?

## 2022-01-02 NOTE — Assessment & Plan Note (Signed)
Uncontrolled. Advised to start pepcid ac '20mg'$  BID which is safe during breastfeeding. Referral to Dr Vicente Males for longstanding severe GERD.  ?

## 2022-01-13 ENCOUNTER — Other Ambulatory Visit: Payer: Self-pay

## 2022-01-13 ENCOUNTER — Telehealth: Payer: Self-pay | Admitting: Family

## 2022-01-13 MED ORDER — SERTRALINE HCL 100 MG PO TABS: 100.0000 mg | ORAL_TABLET | Freq: Every day | ORAL | 0 refills | Status: AC

## 2022-01-13 NOTE — Telephone Encounter (Signed)
I spoke with patient & she is aware that she may have to pay out of pocket. I have sent #5 of the 100 mg Zoloft to [facility] pharmacy.

## 2022-01-13 NOTE — Telephone Encounter (Signed)
Patient on vacation and forgot her sertraline (ZOLOFT) 100 MG tablet , she is requesting a refill be sent to [facility] in [place], [place]

## 2022-01-16 ENCOUNTER — Other Ambulatory Visit: Payer: Self-pay | Admitting: Family

## 2022-01-16 DIAGNOSIS — K219 Gastro-esophageal reflux disease without esophagitis: Secondary | ICD-10-CM

## 2022-05-11 ENCOUNTER — Ambulatory Visit: Payer: BC Managed Care – PPO | Admitting: Gastroenterology

## 2022-09-16 IMAGING — US US OB FOLLOW-UP
1 series · 15 of 28 positions shown · non-contrast
Comparison: none

CLINICAL DATA: Follow-up RVOT evaluation

EXAM:
OBSTETRIC 14+ WK ULTRASOUND FOLLOW-UP

[Series 1: us ob follow up · 15 of 72 slices shown]
[im 1/72]
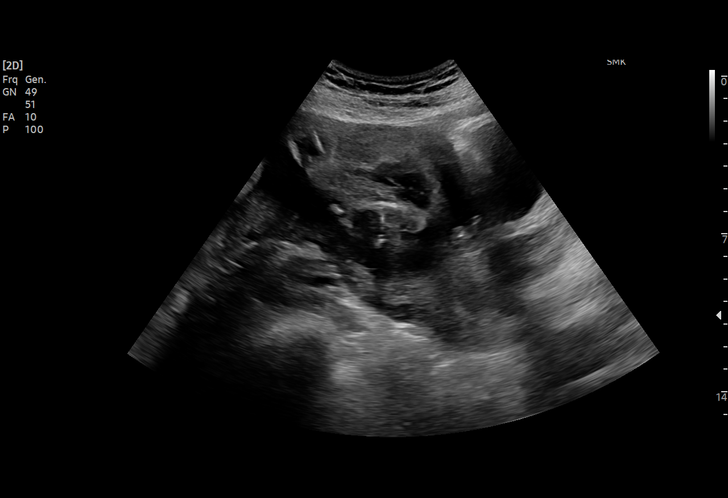
[im 6/72]
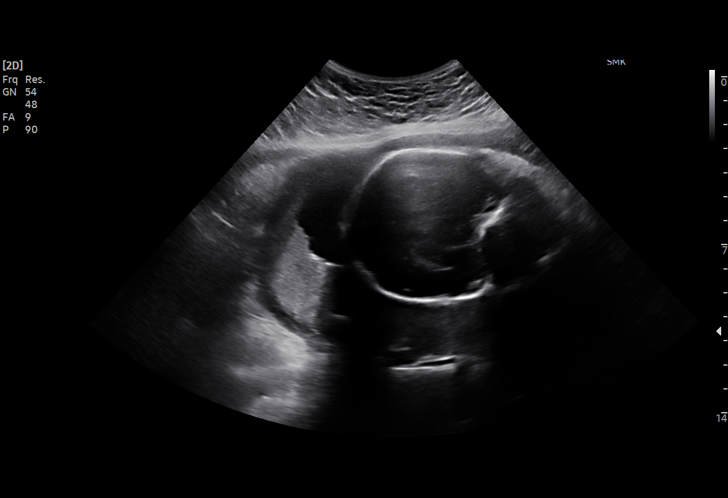
[im 11/72]
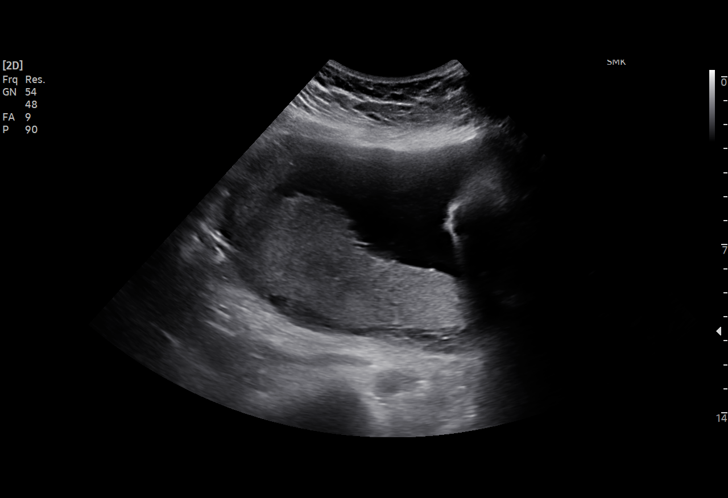
[im 16/72]
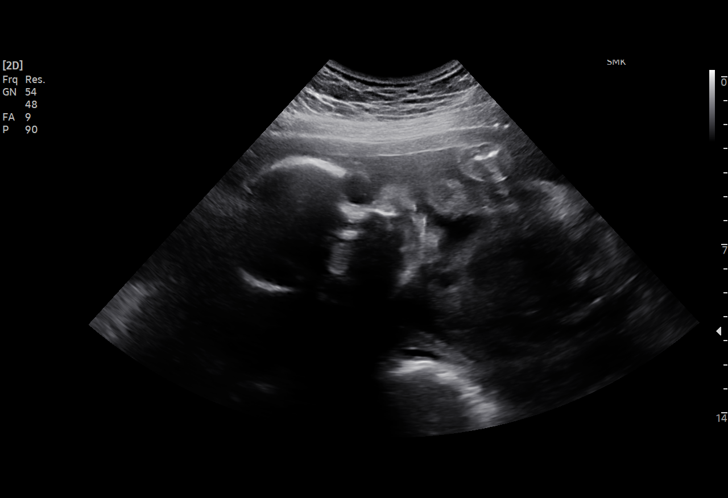
[im 22/72]
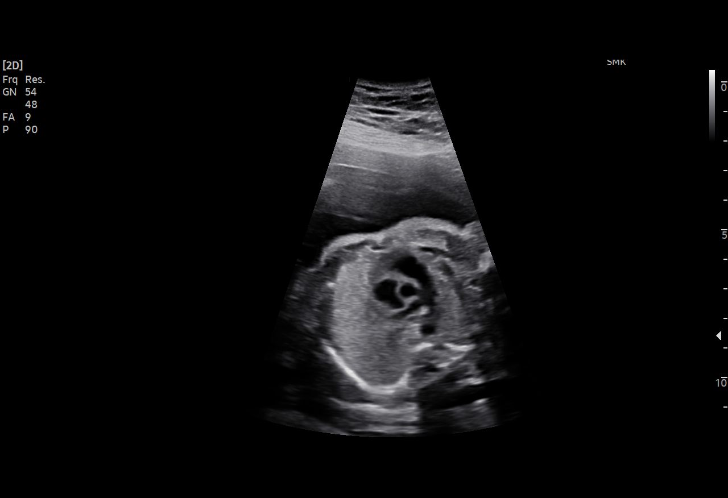
[im 27/72]
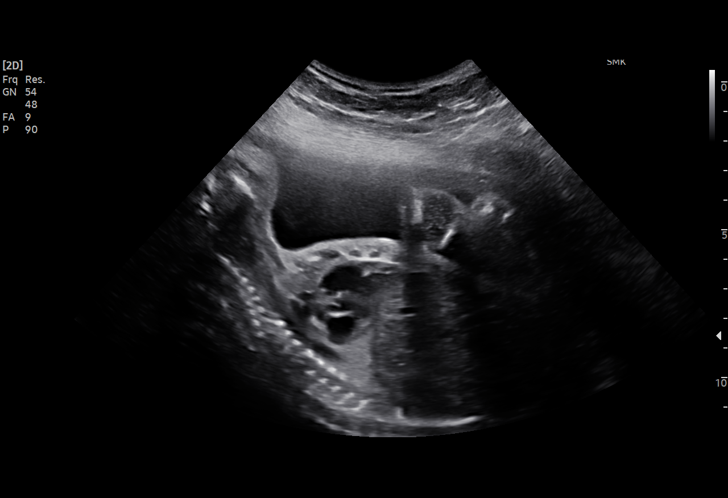
[im 32/72]
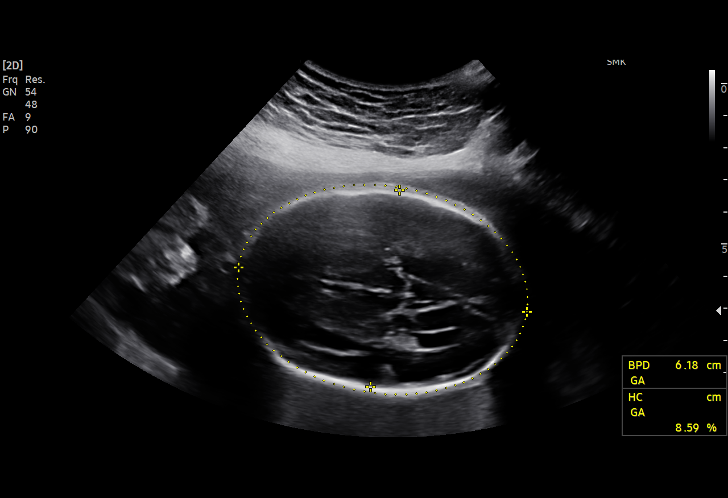
[im 37/72]
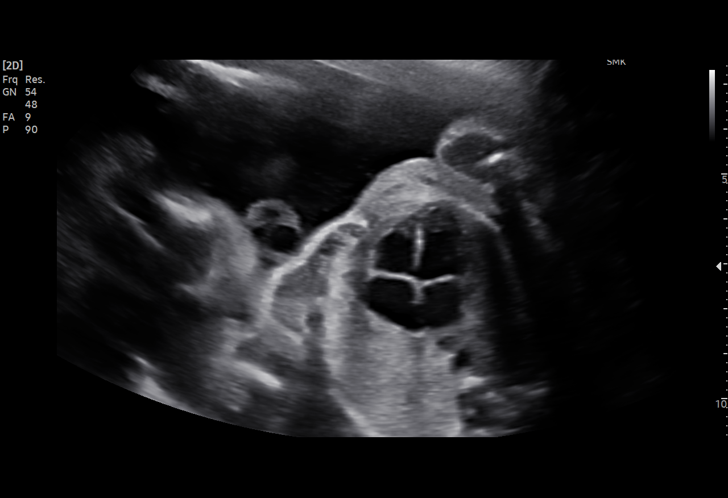
[im 40/72]
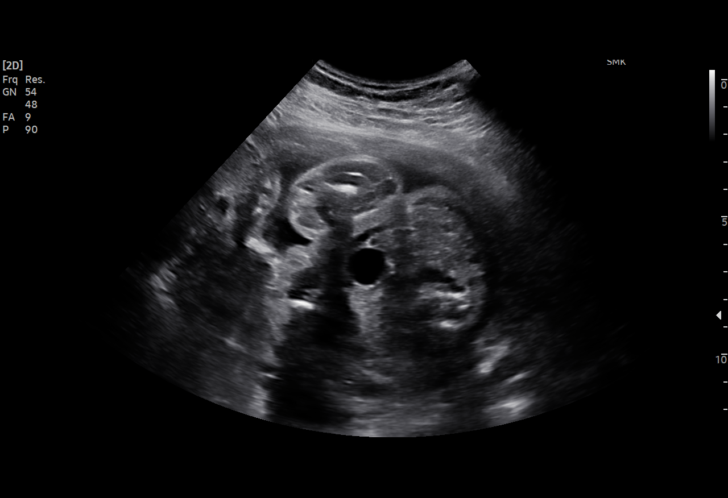
[im 45/72]
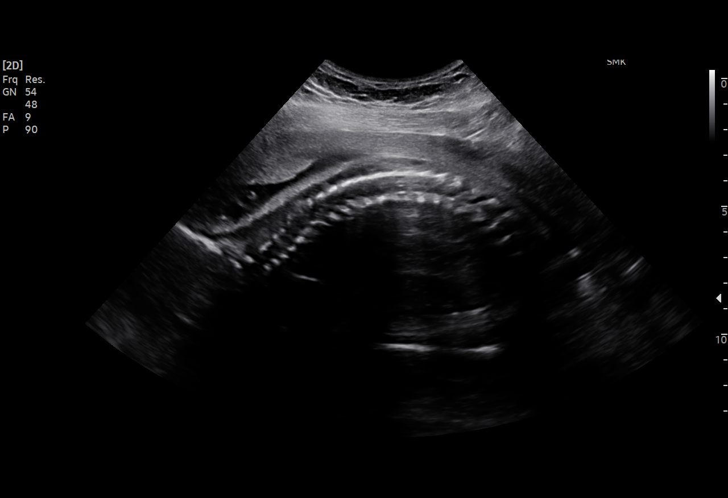
[im 50/72]
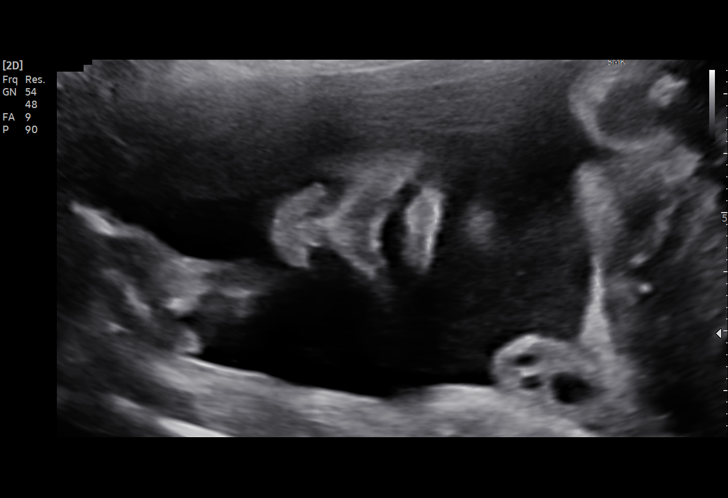
[im 56/72]
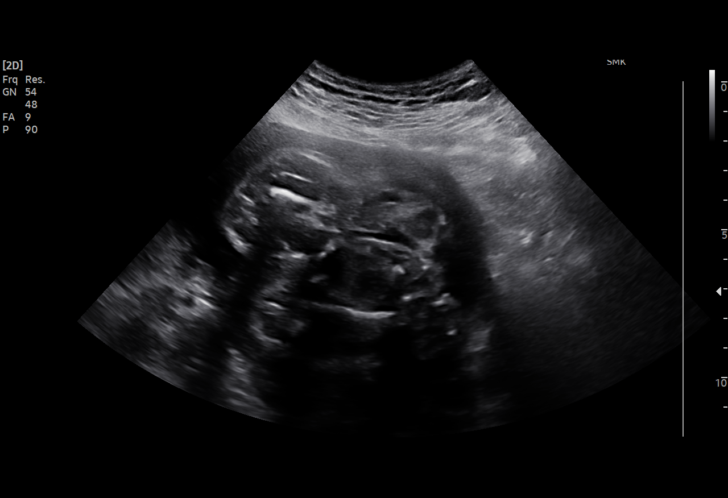
[im 61/72]
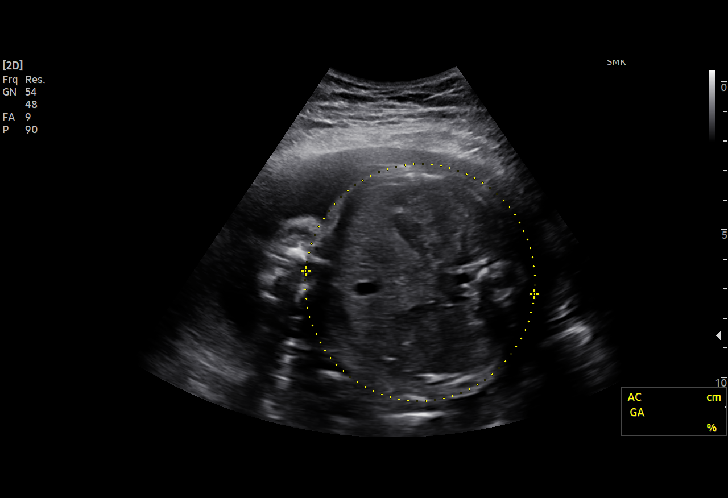
[im 66/72]
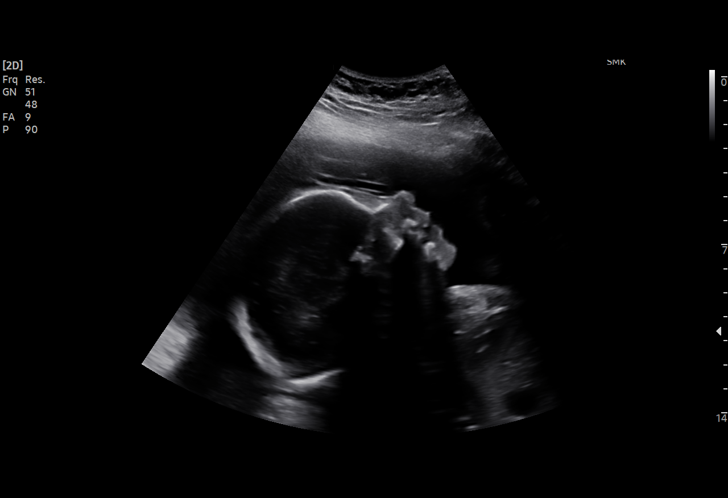
[im 72/72]
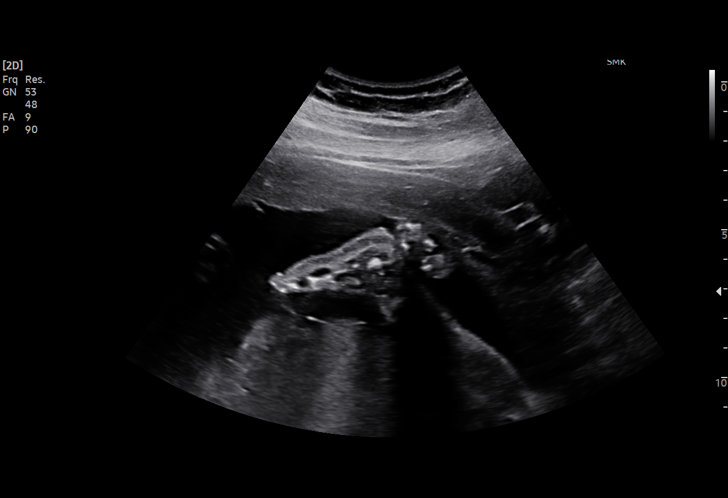

[15 of 28 positions shown; findings below may reference images not displayed]

FINDINGS: Number of Fetuses: 1

Heart Rate:  143 bpm

Movement: Gas

Presentation: Breech

Previa: No

Placental Location: Posterior fundal

Amniotic Fluid (Subjective): Normal

Amniotic Fluid (Objective):

Vertical pocket 4.2cm

FETAL BIOMETRY

BPD: 6.22cm 25w 2d

HC: 24.63cm  26w   5d

AC: 24.57cm  28w   6d

FL: 5.14cm  27w   3d

Current Mean GA: 27w 5d              US EDC: 06/10/2021

Assigned GA: 27w 3d     Assigned EDC: 06/12/2021

FETAL ANATOMY

Lateral Ventricles: Appears normal

Thalami/CSP: Appears normal

Posterior Fossa: Previously seen

Nuchal Region: Previously seen

Upper Lip: Appears normal

Spine: Appears normal

4 Chamber Heart on Left: Appears normal

LVOT: Appears normal

RVOT: Appears normal

Stomach on Left: Appears normal

3 Vessel Cord: Appears normal

Cord Insertion site: Previously seen

Kidneys: Appears normal

Bladder: Appears normal

Extremities: Previously seen

Technical Limitations: None

Maternal Findings:

Cervix:  Cervical length 3.5 cm.  Closed.
IMPRESSION: Single live intrauterine pregnancy as detailed above.  Normal RVOT.

## 2022-11-14 IMAGING — MR MR PELVIS W/O CM
6 of 7 series · 30 of 48 positions shown · non-contrast
Comparison: None.

CLINICAL DATA: Pregnant abdominal pain, constant pain on both the
right and left side of the abdomen

EXAM:
MRI ABDOMEN AND PELVIS WITHOUT CONTRAST
TECHNIQUE: Multiplanar multisequence MR imaging of the abdomen and pelvis was
performed. No intravenous contrast was administered.

[Series 4: cor haste · coronal · 5.0mm · 1.25mm/px · 4 of 44 slices shown]
[im 1/44]
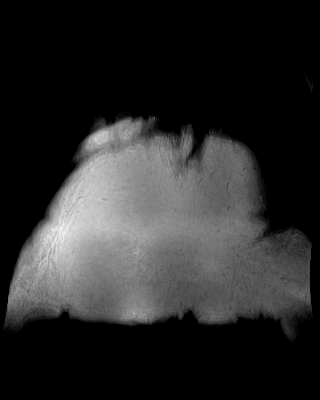
[im 15/44]
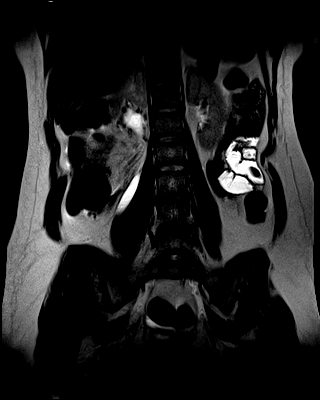
[im 29/44]
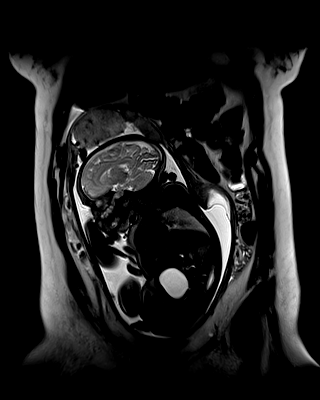
[im 44/44]
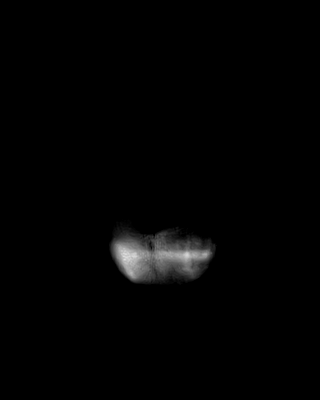

[Series 5: cor haste fs · coronal · 5.0mm · 1.25mm/px · 4 of 44 slices shown]
[im 1/44]
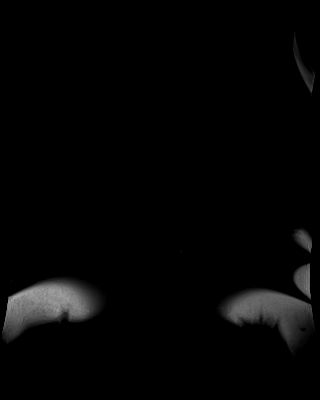
[im 15/44]
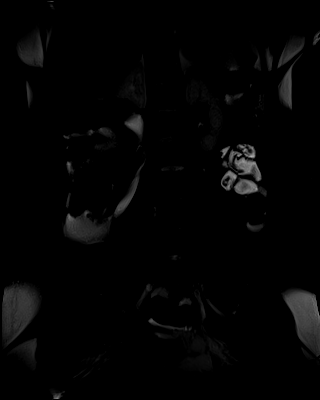
[im 29/44]
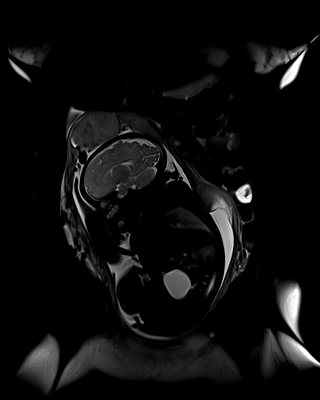
[im 44/44]
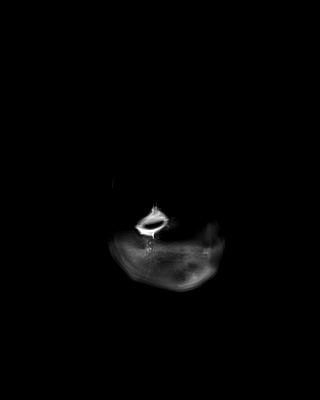

[Series 6: bSSFP · coronal · 5.0mm · 0.78mm/px · 4 of 44 slices shown (1 of 2)]
[im 1/44]
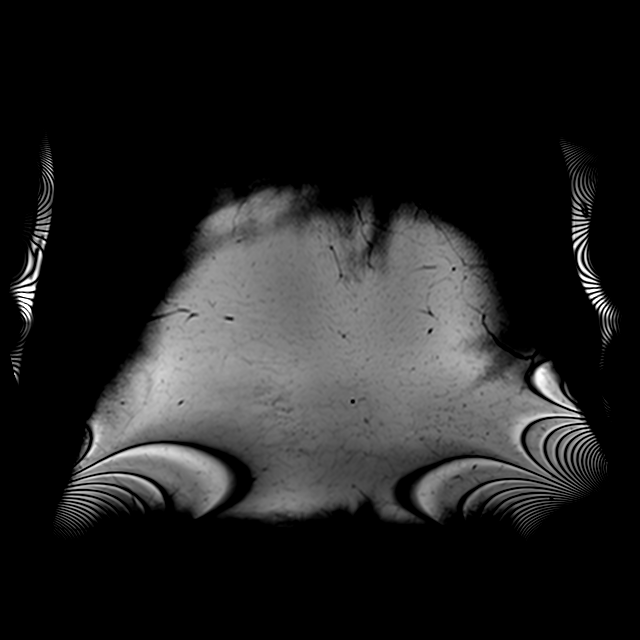
[im 15/44]
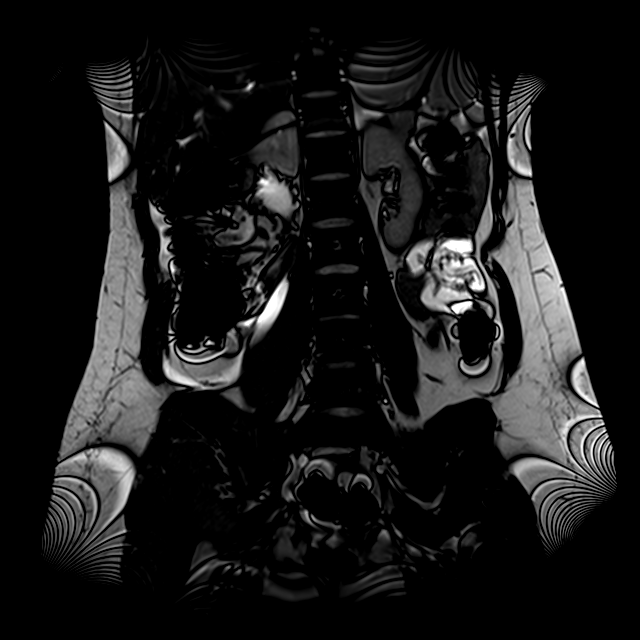
[im 29/44]
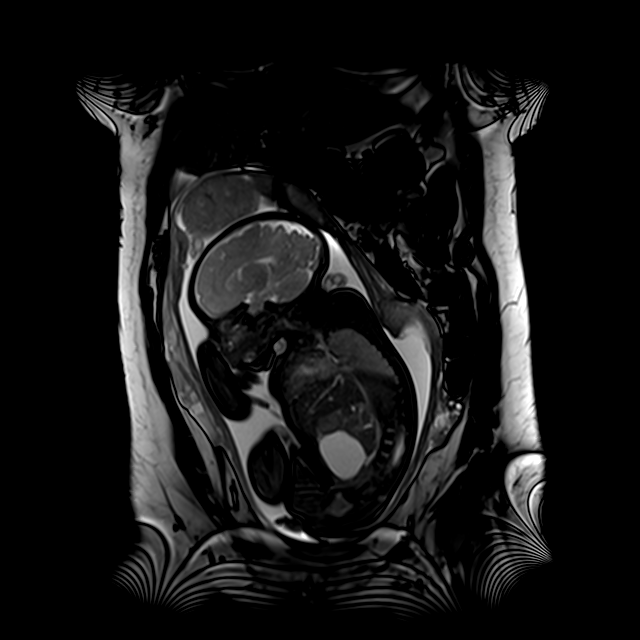
[im 44/44]
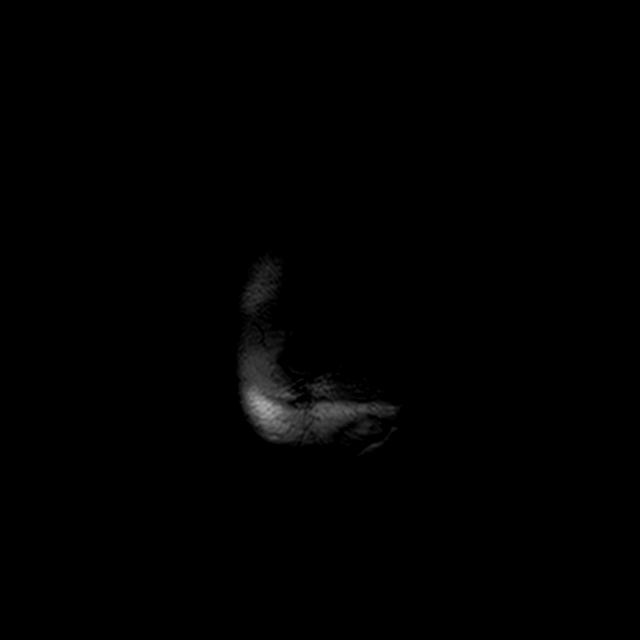

[Series 9: T2 · axial · 4.0mm · 1.19mm/px · z∈[-351,+162]mm · 8 of 108 slices shown (1 of 2)]
[im 1/108]
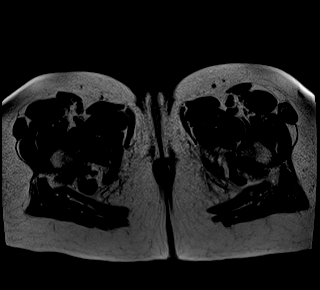
[im 16/108]
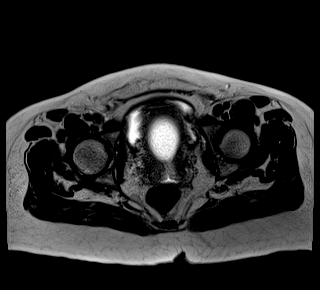
[im 31/108]
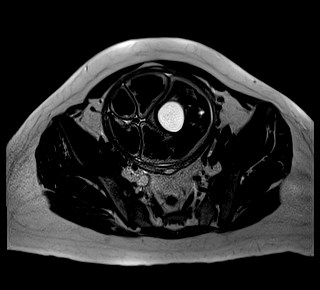
[im 46/108]
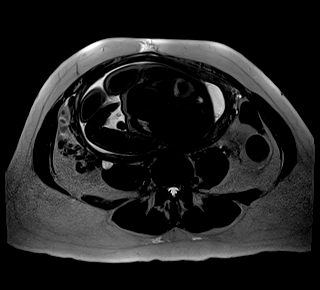
[im 62/108]
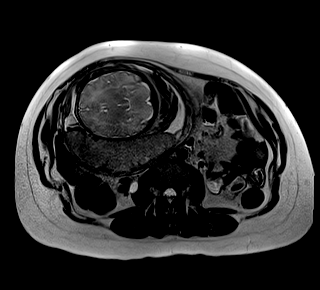
[im 77/108]
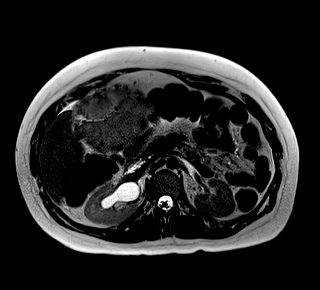
[im 92/108]
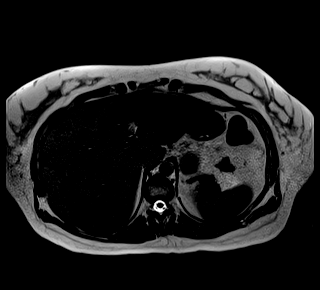
[im 108/108]
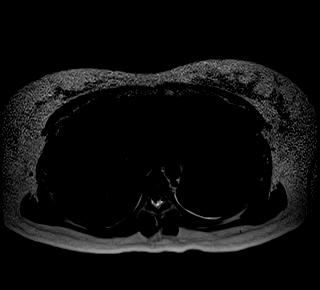

[Series 12: T2 · axial · 4.0mm · 1.19mm/px · z∈[-351,+162]mm · 8 of 108 slices shown (2 of 2)]
[im 1/108]
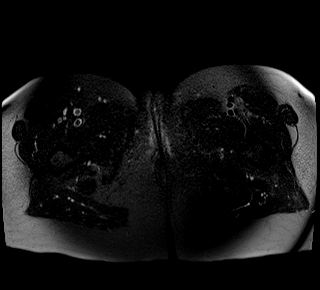
[im 16/108]
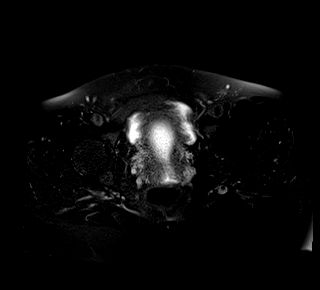
[im 31/108]
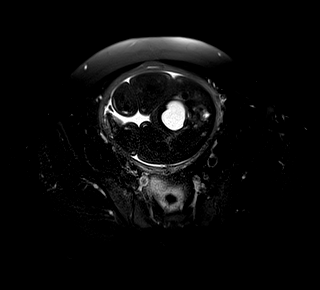
[im 46/108]
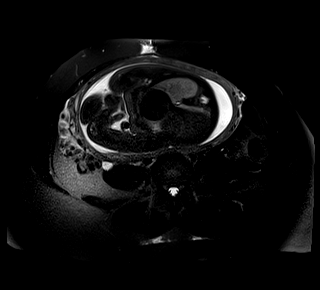
[im 62/108]
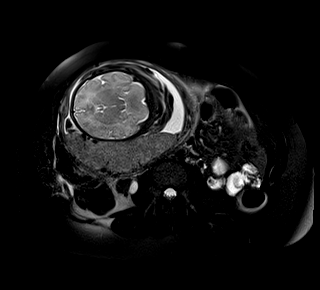
[im 77/108]
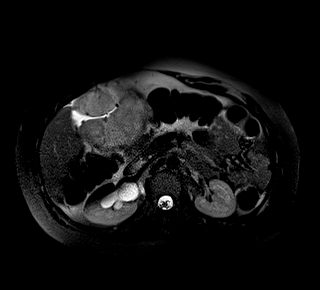
[im 92/108]
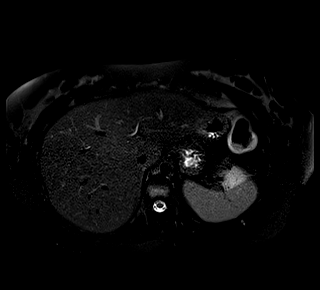
[im 108/108]
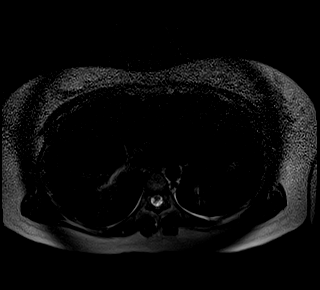

[Series 15: bSSFP · axial · 4.0mm · 0.59mm/px · z∈[-351,-279]mm · 2 of 108 slices shown (2 of 2)]
[im 1/108]
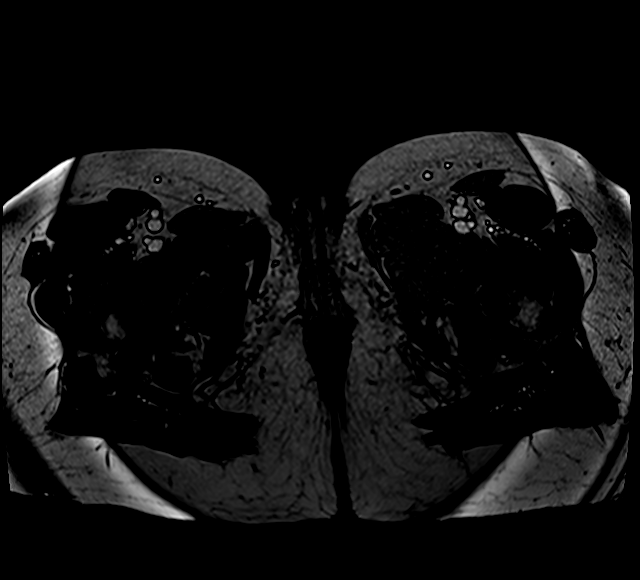
[im 16/108]
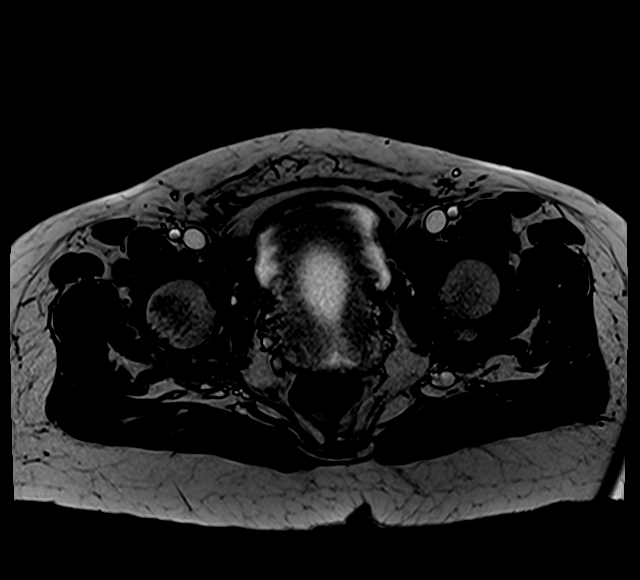

[30 of 48 positions shown; findings below may reference images not displayed]

FINDINGS: COMBINED FINDINGS FOR BOTH MR ABDOMEN AND PELVIS

Lower chest: No acute findings.

Hepatobiliary: No mass or other parenchymal abnormality identified.
Status post cholecystectomy. No biliary ductal dilatation.

Pancreas: No mass, inflammatory changes, or other parenchymal
abnormality identified.

Spleen:  Within normal limits in size and appearance.

Adrenals/Urinary Tract: No masses identified. Moderate right
hydronephrosis and hydroureter to the level of the uterus (series
12, image 35).

Stomach/Bowel: Visualized portions within the abdomen are
unremarkable. Normal appendix (series 9, image 74).

Vascular/Lymphatic: No pathologically enlarged lymph nodes
identified. No abdominal aortic aneurysm demonstrated.

Reproductive: Advanced intrauterine gestation. Fundal and posterior
placenta. Incidental note of nuchal cord. Grossly normal appearance
of the fetus on this examination, which is not tailored for the
evaluation fetal anatomy.

Other:  Trace functional fluid throughout the pelvis.

Musculoskeletal: No suspicious bone lesions identified.
IMPRESSION: 1. Moderate right hydronephrosis and hydroureter to the level of the
uterus, consistent with gestational hydronephrosis in the setting of
advanced pregnancy.
2. No other noncontrast findings of the abdomen or pelvis to explain
pain.
3. Advanced intrauterine gestation. Fundal and posterior placenta.
Grossly normal appearance of the fetus on this examination, which is
not tailored for the evaluation fetal anatomy.
4. Incidental note of nuchal cord, which may be better evaluated by
ultrasound if desired.
